# Patient Record
Sex: Male | Born: 1950
Health system: Southern US, Community
[De-identification: ages and names within clinical notes are randomized; demographics above are authoritative.]

## PROBLEM LIST (undated history)

## (undated) DIAGNOSIS — G709 Myoneural disorder, unspecified: Secondary | ICD-10-CM

## (undated) DIAGNOSIS — M5020 Other cervical disc displacement, unspecified cervical region: Secondary | ICD-10-CM

## (undated) DIAGNOSIS — E119 Type 2 diabetes mellitus without complications: Secondary | ICD-10-CM

## (undated) DIAGNOSIS — F32A Depression, unspecified: Secondary | ICD-10-CM

## (undated) DIAGNOSIS — G4730 Sleep apnea, unspecified: Secondary | ICD-10-CM

## (undated) DIAGNOSIS — E785 Hyperlipidemia, unspecified: Secondary | ICD-10-CM

## (undated) DIAGNOSIS — F329 Major depressive disorder, single episode, unspecified: Secondary | ICD-10-CM

## (undated) DIAGNOSIS — Z87442 Personal history of urinary calculi: Secondary | ICD-10-CM

## (undated) DIAGNOSIS — K219 Gastro-esophageal reflux disease without esophagitis: Secondary | ICD-10-CM

## (undated) DIAGNOSIS — I10 Essential (primary) hypertension: Secondary | ICD-10-CM

## (undated) DIAGNOSIS — M1990 Unspecified osteoarthritis, unspecified site: Secondary | ICD-10-CM

## (undated) HISTORY — PX: EYE SURGERY: SHX253

## (undated) HISTORY — DX: Unspecified osteoarthritis, unspecified site: M19.90

## (undated) HISTORY — DX: Hyperlipidemia, unspecified: E78.5

---

## 2011-03-28 DIAGNOSIS — F339 Major depressive disorder, recurrent, unspecified: Secondary | ICD-10-CM | POA: Insufficient documentation

## 2011-03-28 DIAGNOSIS — E785 Hyperlipidemia, unspecified: Secondary | ICD-10-CM | POA: Insufficient documentation

## 2011-03-28 DIAGNOSIS — I10 Essential (primary) hypertension: Secondary | ICD-10-CM | POA: Insufficient documentation

## 2011-09-04 HISTORY — PX: COLONOSCOPY: SHX174

## 2011-09-25 DIAGNOSIS — Z1211 Encounter for screening for malignant neoplasm of colon: Secondary | ICD-10-CM | POA: Insufficient documentation

## 2011-09-25 LAB — HM COLONOSCOPY

## 2012-06-09 DIAGNOSIS — M542 Cervicalgia: Secondary | ICD-10-CM | POA: Insufficient documentation

## 2012-06-13 DIAGNOSIS — H52229 Regular astigmatism, unspecified eye: Secondary | ICD-10-CM | POA: Insufficient documentation

## 2012-06-13 DIAGNOSIS — E119 Type 2 diabetes mellitus without complications: Secondary | ICD-10-CM | POA: Insufficient documentation

## 2012-06-13 DIAGNOSIS — IMO0002 Reserved for concepts with insufficient information to code with codable children: Secondary | ICD-10-CM | POA: Insufficient documentation

## 2012-06-13 DIAGNOSIS — Z961 Presence of intraocular lens: Secondary | ICD-10-CM | POA: Insufficient documentation

## 2012-06-13 DIAGNOSIS — H18599 Other hereditary corneal dystrophies, unspecified eye: Secondary | ICD-10-CM | POA: Insufficient documentation

## 2012-07-29 DIAGNOSIS — G4733 Obstructive sleep apnea (adult) (pediatric): Secondary | ICD-10-CM | POA: Insufficient documentation

## 2013-05-29 DIAGNOSIS — N529 Male erectile dysfunction, unspecified: Secondary | ICD-10-CM | POA: Insufficient documentation

## 2013-05-29 DIAGNOSIS — G4700 Insomnia, unspecified: Secondary | ICD-10-CM | POA: Insufficient documentation

## 2013-05-29 DIAGNOSIS — M5412 Radiculopathy, cervical region: Secondary | ICD-10-CM | POA: Insufficient documentation

## 2013-11-12 DIAGNOSIS — H01009 Unspecified blepharitis unspecified eye, unspecified eyelid: Secondary | ICD-10-CM | POA: Insufficient documentation

## 2013-11-12 DIAGNOSIS — H1851 Endothelial corneal dystrophy: Secondary | ICD-10-CM

## 2013-11-12 DIAGNOSIS — Z0389 Encounter for observation for other suspected diseases and conditions ruled out: Secondary | ICD-10-CM

## 2013-11-12 DIAGNOSIS — H43819 Vitreous degeneration, unspecified eye: Secondary | ICD-10-CM | POA: Insufficient documentation

## 2013-11-12 DIAGNOSIS — Z961 Presence of intraocular lens: Secondary | ICD-10-CM | POA: Insufficient documentation

## 2013-11-12 DIAGNOSIS — H18519 Endothelial corneal dystrophy, unspecified eye: Secondary | ICD-10-CM | POA: Insufficient documentation

## 2013-11-12 DIAGNOSIS — IMO0001 Reserved for inherently not codable concepts without codable children: Secondary | ICD-10-CM | POA: Insufficient documentation

## 2014-07-19 DIAGNOSIS — N3941 Urge incontinence: Secondary | ICD-10-CM | POA: Insufficient documentation

## 2014-09-03 HISTORY — PX: BASAL CELL CARCINOMA EXCISION: SHX1214

## 2015-04-26 DIAGNOSIS — M25521 Pain in right elbow: Secondary | ICD-10-CM | POA: Insufficient documentation

## 2015-04-26 DIAGNOSIS — K219 Gastro-esophageal reflux disease without esophagitis: Secondary | ICD-10-CM | POA: Insufficient documentation

## 2015-04-26 DIAGNOSIS — L918 Other hypertrophic disorders of the skin: Secondary | ICD-10-CM | POA: Insufficient documentation

## 2015-06-16 DIAGNOSIS — C44519 Basal cell carcinoma of skin of other part of trunk: Secondary | ICD-10-CM | POA: Insufficient documentation

## 2015-08-05 DIAGNOSIS — N189 Chronic kidney disease, unspecified: Secondary | ICD-10-CM | POA: Insufficient documentation

## 2015-10-10 DIAGNOSIS — E7800 Pure hypercholesterolemia, unspecified: Secondary | ICD-10-CM | POA: Diagnosis not present

## 2015-10-10 DIAGNOSIS — E1122 Type 2 diabetes mellitus with diabetic chronic kidney disease: Secondary | ICD-10-CM | POA: Diagnosis not present

## 2015-10-10 DIAGNOSIS — F322 Major depressive disorder, single episode, severe without psychotic features: Secondary | ICD-10-CM | POA: Diagnosis not present

## 2015-10-10 DIAGNOSIS — Z7984 Long term (current) use of oral hypoglycemic drugs: Secondary | ICD-10-CM | POA: Diagnosis not present

## 2015-10-10 DIAGNOSIS — M5090 Cervical disc disorder, unspecified, unspecified cervical region: Secondary | ICD-10-CM | POA: Diagnosis not present

## 2015-10-10 DIAGNOSIS — I10 Essential (primary) hypertension: Secondary | ICD-10-CM | POA: Diagnosis not present

## 2015-10-10 DIAGNOSIS — N183 Chronic kidney disease, stage 3 (moderate): Secondary | ICD-10-CM | POA: Diagnosis not present

## 2015-10-10 DIAGNOSIS — G4733 Obstructive sleep apnea (adult) (pediatric): Secondary | ICD-10-CM | POA: Diagnosis not present

## 2015-10-10 DIAGNOSIS — Z1389 Encounter for screening for other disorder: Secondary | ICD-10-CM | POA: Diagnosis not present

## 2015-10-10 DIAGNOSIS — K219 Gastro-esophageal reflux disease without esophagitis: Secondary | ICD-10-CM | POA: Diagnosis not present

## 2016-01-25 DIAGNOSIS — F322 Major depressive disorder, single episode, severe without psychotic features: Secondary | ICD-10-CM | POA: Diagnosis not present

## 2016-01-25 DIAGNOSIS — I10 Essential (primary) hypertension: Secondary | ICD-10-CM | POA: Diagnosis not present

## 2016-01-25 DIAGNOSIS — E7800 Pure hypercholesterolemia, unspecified: Secondary | ICD-10-CM | POA: Diagnosis not present

## 2016-01-25 DIAGNOSIS — N183 Chronic kidney disease, stage 3 (moderate): Secondary | ICD-10-CM | POA: Diagnosis not present

## 2016-01-25 DIAGNOSIS — Z7984 Long term (current) use of oral hypoglycemic drugs: Secondary | ICD-10-CM | POA: Diagnosis not present

## 2016-01-25 DIAGNOSIS — M5090 Cervical disc disorder, unspecified, unspecified cervical region: Secondary | ICD-10-CM | POA: Diagnosis not present

## 2016-01-25 DIAGNOSIS — Z1389 Encounter for screening for other disorder: Secondary | ICD-10-CM | POA: Diagnosis not present

## 2016-01-25 DIAGNOSIS — K219 Gastro-esophageal reflux disease without esophagitis: Secondary | ICD-10-CM | POA: Diagnosis not present

## 2016-01-25 DIAGNOSIS — Z0000 Encounter for general adult medical examination without abnormal findings: Secondary | ICD-10-CM | POA: Diagnosis not present

## 2016-01-25 DIAGNOSIS — G4733 Obstructive sleep apnea (adult) (pediatric): Secondary | ICD-10-CM | POA: Diagnosis not present

## 2016-01-25 DIAGNOSIS — E1122 Type 2 diabetes mellitus with diabetic chronic kidney disease: Secondary | ICD-10-CM | POA: Diagnosis not present

## 2016-01-25 DIAGNOSIS — Z125 Encounter for screening for malignant neoplasm of prostate: Secondary | ICD-10-CM | POA: Diagnosis not present

## 2016-02-20 DIAGNOSIS — H40011 Open angle with borderline findings, low risk, right eye: Secondary | ICD-10-CM | POA: Diagnosis not present

## 2016-02-20 DIAGNOSIS — H52203 Unspecified astigmatism, bilateral: Secondary | ICD-10-CM | POA: Diagnosis not present

## 2016-02-20 DIAGNOSIS — E119 Type 2 diabetes mellitus without complications: Secondary | ICD-10-CM | POA: Diagnosis not present

## 2016-02-20 DIAGNOSIS — H40012 Open angle with borderline findings, low risk, left eye: Secondary | ICD-10-CM | POA: Diagnosis not present

## 2016-07-16 DIAGNOSIS — H40012 Open angle with borderline findings, low risk, left eye: Secondary | ICD-10-CM | POA: Diagnosis not present

## 2016-07-16 DIAGNOSIS — H40011 Open angle with borderline findings, low risk, right eye: Secondary | ICD-10-CM | POA: Diagnosis not present

## 2016-08-01 DIAGNOSIS — N183 Chronic kidney disease, stage 3 (moderate): Secondary | ICD-10-CM | POA: Diagnosis not present

## 2016-08-01 DIAGNOSIS — E1122 Type 2 diabetes mellitus with diabetic chronic kidney disease: Secondary | ICD-10-CM | POA: Diagnosis not present

## 2016-08-01 DIAGNOSIS — K219 Gastro-esophageal reflux disease without esophagitis: Secondary | ICD-10-CM | POA: Diagnosis not present

## 2016-08-01 DIAGNOSIS — Z7984 Long term (current) use of oral hypoglycemic drugs: Secondary | ICD-10-CM | POA: Diagnosis not present

## 2016-08-01 DIAGNOSIS — G4733 Obstructive sleep apnea (adult) (pediatric): Secondary | ICD-10-CM | POA: Diagnosis not present

## 2016-08-01 DIAGNOSIS — E7800 Pure hypercholesterolemia, unspecified: Secondary | ICD-10-CM | POA: Diagnosis not present

## 2016-08-01 DIAGNOSIS — I10 Essential (primary) hypertension: Secondary | ICD-10-CM | POA: Diagnosis not present

## 2016-08-01 DIAGNOSIS — Z23 Encounter for immunization: Secondary | ICD-10-CM | POA: Diagnosis not present

## 2016-08-01 DIAGNOSIS — F322 Major depressive disorder, single episode, severe without psychotic features: Secondary | ICD-10-CM | POA: Diagnosis not present

## 2016-08-01 DIAGNOSIS — J309 Allergic rhinitis, unspecified: Secondary | ICD-10-CM | POA: Diagnosis not present

## 2016-09-14 ENCOUNTER — Other Ambulatory Visit: Payer: Self-pay | Admitting: Nurse Practitioner

## 2016-09-14 ENCOUNTER — Ambulatory Visit
Admission: RE | Admit: 2016-09-14 | Discharge: 2016-09-14 | Disposition: A | Discharge status: Home / Self Care | Payer: PPO | Source: Ambulatory Visit | Attending: Nurse Practitioner | Admitting: Nurse Practitioner

## 2016-09-14 DIAGNOSIS — R0602 Shortness of breath: Secondary | ICD-10-CM | POA: Diagnosis not present

## 2016-09-14 DIAGNOSIS — J069 Acute upper respiratory infection, unspecified: Secondary | ICD-10-CM | POA: Diagnosis not present

## 2016-09-14 DIAGNOSIS — R05 Cough: Secondary | ICD-10-CM | POA: Diagnosis not present

## 2016-09-14 DIAGNOSIS — R509 Fever, unspecified: Secondary | ICD-10-CM | POA: Diagnosis not present

## 2016-10-16 DIAGNOSIS — G4733 Obstructive sleep apnea (adult) (pediatric): Secondary | ICD-10-CM | POA: Diagnosis not present

## 2016-11-15 DIAGNOSIS — G4733 Obstructive sleep apnea (adult) (pediatric): Secondary | ICD-10-CM | POA: Diagnosis not present

## 2016-11-27 DIAGNOSIS — F332 Major depressive disorder, recurrent severe without psychotic features: Secondary | ICD-10-CM | POA: Diagnosis not present

## 2016-12-20 DIAGNOSIS — G4700 Insomnia, unspecified: Secondary | ICD-10-CM | POA: Diagnosis not present

## 2017-01-22 DIAGNOSIS — H43813 Vitreous degeneration, bilateral: Secondary | ICD-10-CM | POA: Diagnosis not present

## 2017-01-22 DIAGNOSIS — H40013 Open angle with borderline findings, low risk, bilateral: Secondary | ICD-10-CM | POA: Diagnosis not present

## 2017-01-22 DIAGNOSIS — H52203 Unspecified astigmatism, bilateral: Secondary | ICD-10-CM | POA: Diagnosis not present

## 2017-01-22 DIAGNOSIS — E119 Type 2 diabetes mellitus without complications: Secondary | ICD-10-CM | POA: Diagnosis not present

## 2017-01-29 DIAGNOSIS — F332 Major depressive disorder, recurrent severe without psychotic features: Secondary | ICD-10-CM | POA: Diagnosis not present

## 2017-01-31 DIAGNOSIS — G4700 Insomnia, unspecified: Secondary | ICD-10-CM | POA: Diagnosis not present

## 2017-02-05 DIAGNOSIS — N183 Chronic kidney disease, stage 3 (moderate): Secondary | ICD-10-CM | POA: Diagnosis not present

## 2017-02-05 DIAGNOSIS — G4700 Insomnia, unspecified: Secondary | ICD-10-CM | POA: Diagnosis not present

## 2017-02-05 DIAGNOSIS — E1122 Type 2 diabetes mellitus with diabetic chronic kidney disease: Secondary | ICD-10-CM | POA: Diagnosis not present

## 2017-02-05 DIAGNOSIS — E7800 Pure hypercholesterolemia, unspecified: Secondary | ICD-10-CM | POA: Diagnosis not present

## 2017-02-05 DIAGNOSIS — Z125 Encounter for screening for malignant neoplasm of prostate: Secondary | ICD-10-CM | POA: Diagnosis not present

## 2017-02-05 DIAGNOSIS — J309 Allergic rhinitis, unspecified: Secondary | ICD-10-CM | POA: Diagnosis not present

## 2017-02-05 DIAGNOSIS — F322 Major depressive disorder, single episode, severe without psychotic features: Secondary | ICD-10-CM | POA: Diagnosis not present

## 2017-02-05 DIAGNOSIS — D229 Melanocytic nevi, unspecified: Secondary | ICD-10-CM | POA: Diagnosis not present

## 2017-02-05 DIAGNOSIS — Z1159 Encounter for screening for other viral diseases: Secondary | ICD-10-CM | POA: Diagnosis not present

## 2017-02-05 DIAGNOSIS — Z7189 Other specified counseling: Secondary | ICD-10-CM | POA: Diagnosis not present

## 2017-02-05 DIAGNOSIS — Z7984 Long term (current) use of oral hypoglycemic drugs: Secondary | ICD-10-CM | POA: Diagnosis not present

## 2017-02-05 DIAGNOSIS — I10 Essential (primary) hypertension: Secondary | ICD-10-CM | POA: Diagnosis not present

## 2017-02-05 DIAGNOSIS — N529 Male erectile dysfunction, unspecified: Secondary | ICD-10-CM | POA: Diagnosis not present

## 2017-02-05 DIAGNOSIS — K219 Gastro-esophageal reflux disease without esophagitis: Secondary | ICD-10-CM | POA: Diagnosis not present

## 2017-02-10 IMAGING — CR DG CHEST 2V
2 series · 2 of 2 positions shown · non-contrast
Comparison: None.

CLINICAL DATA: Shortness of breath, cough and congestion for 8
days.

EXAM:
CHEST  2 VIEW

[w chest lat]
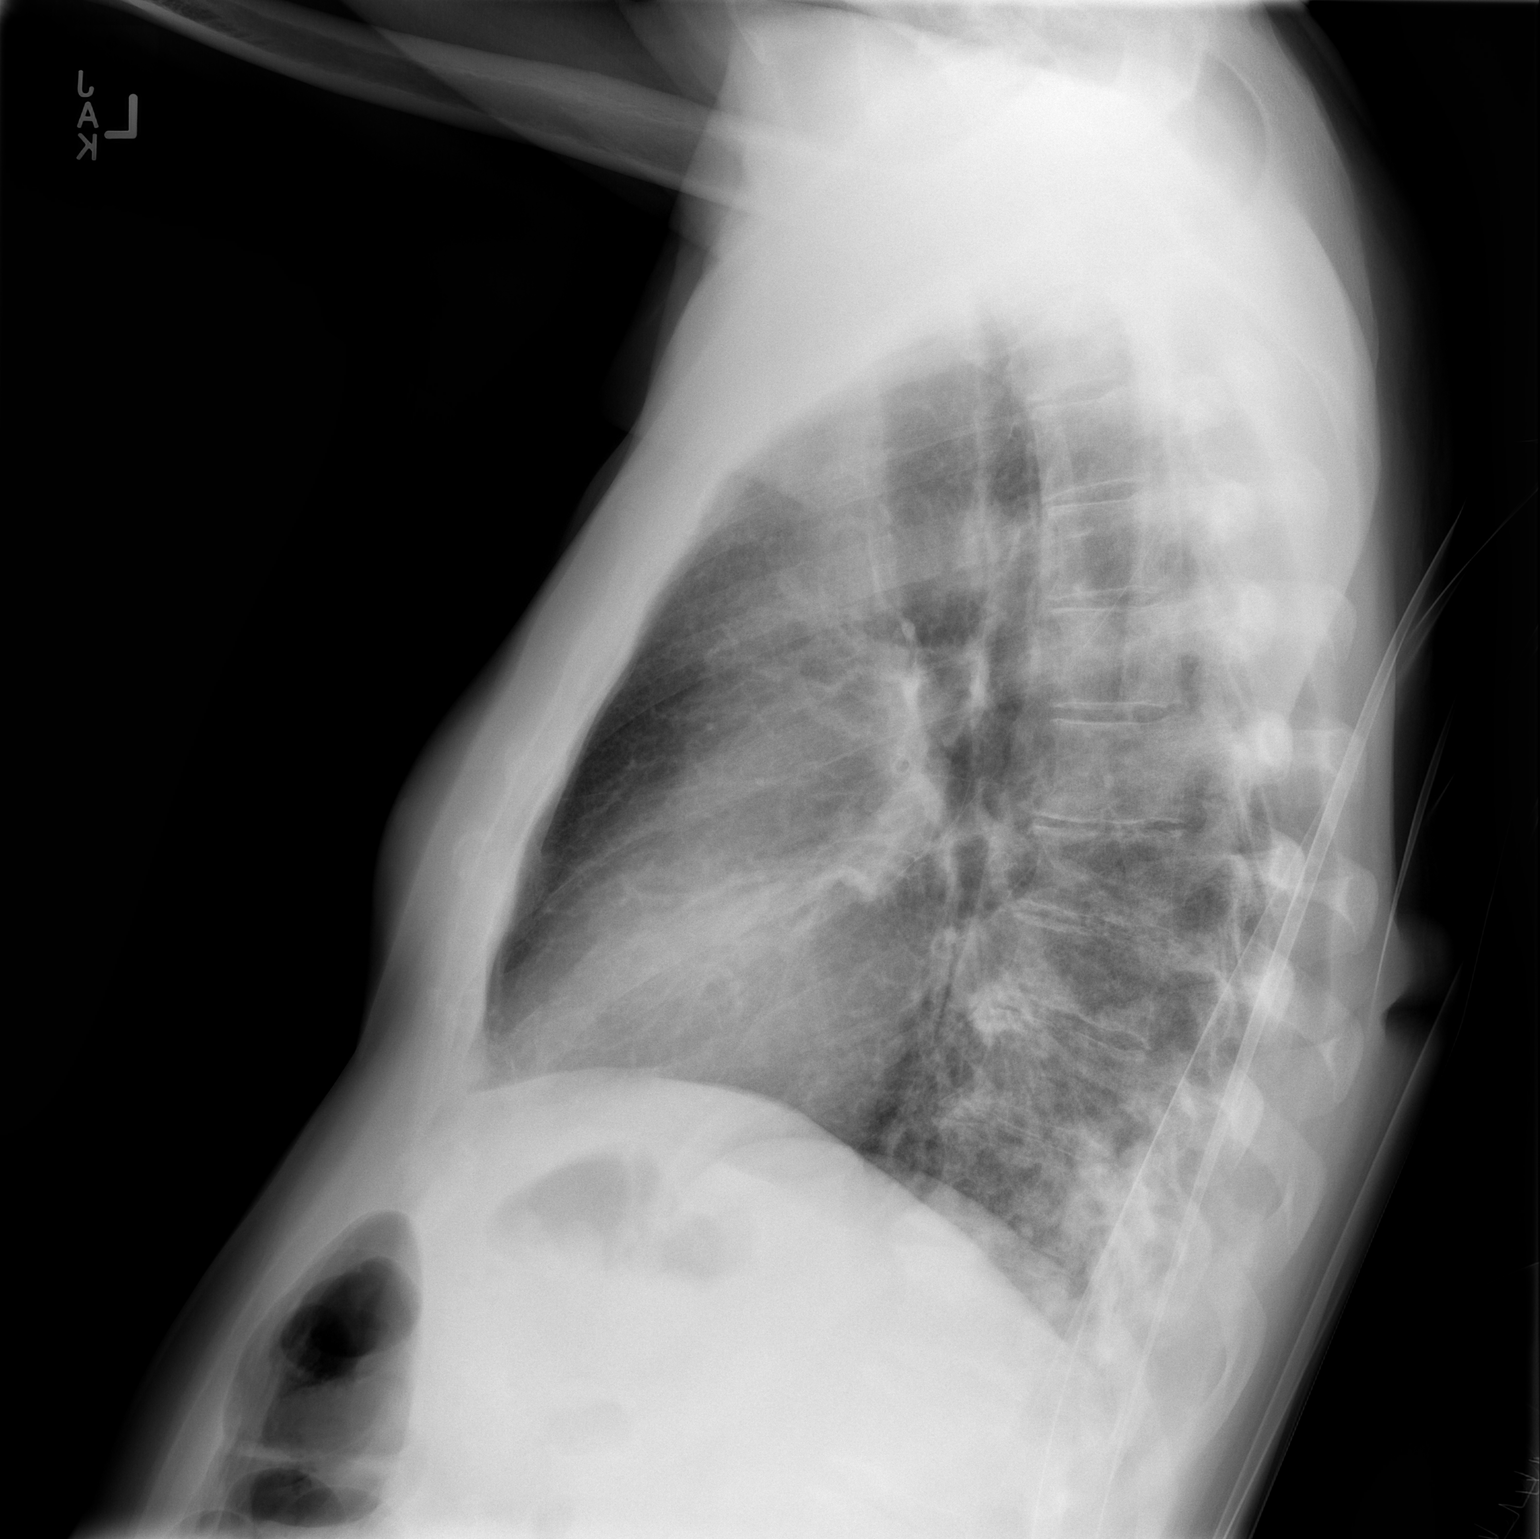

[w chest ap]
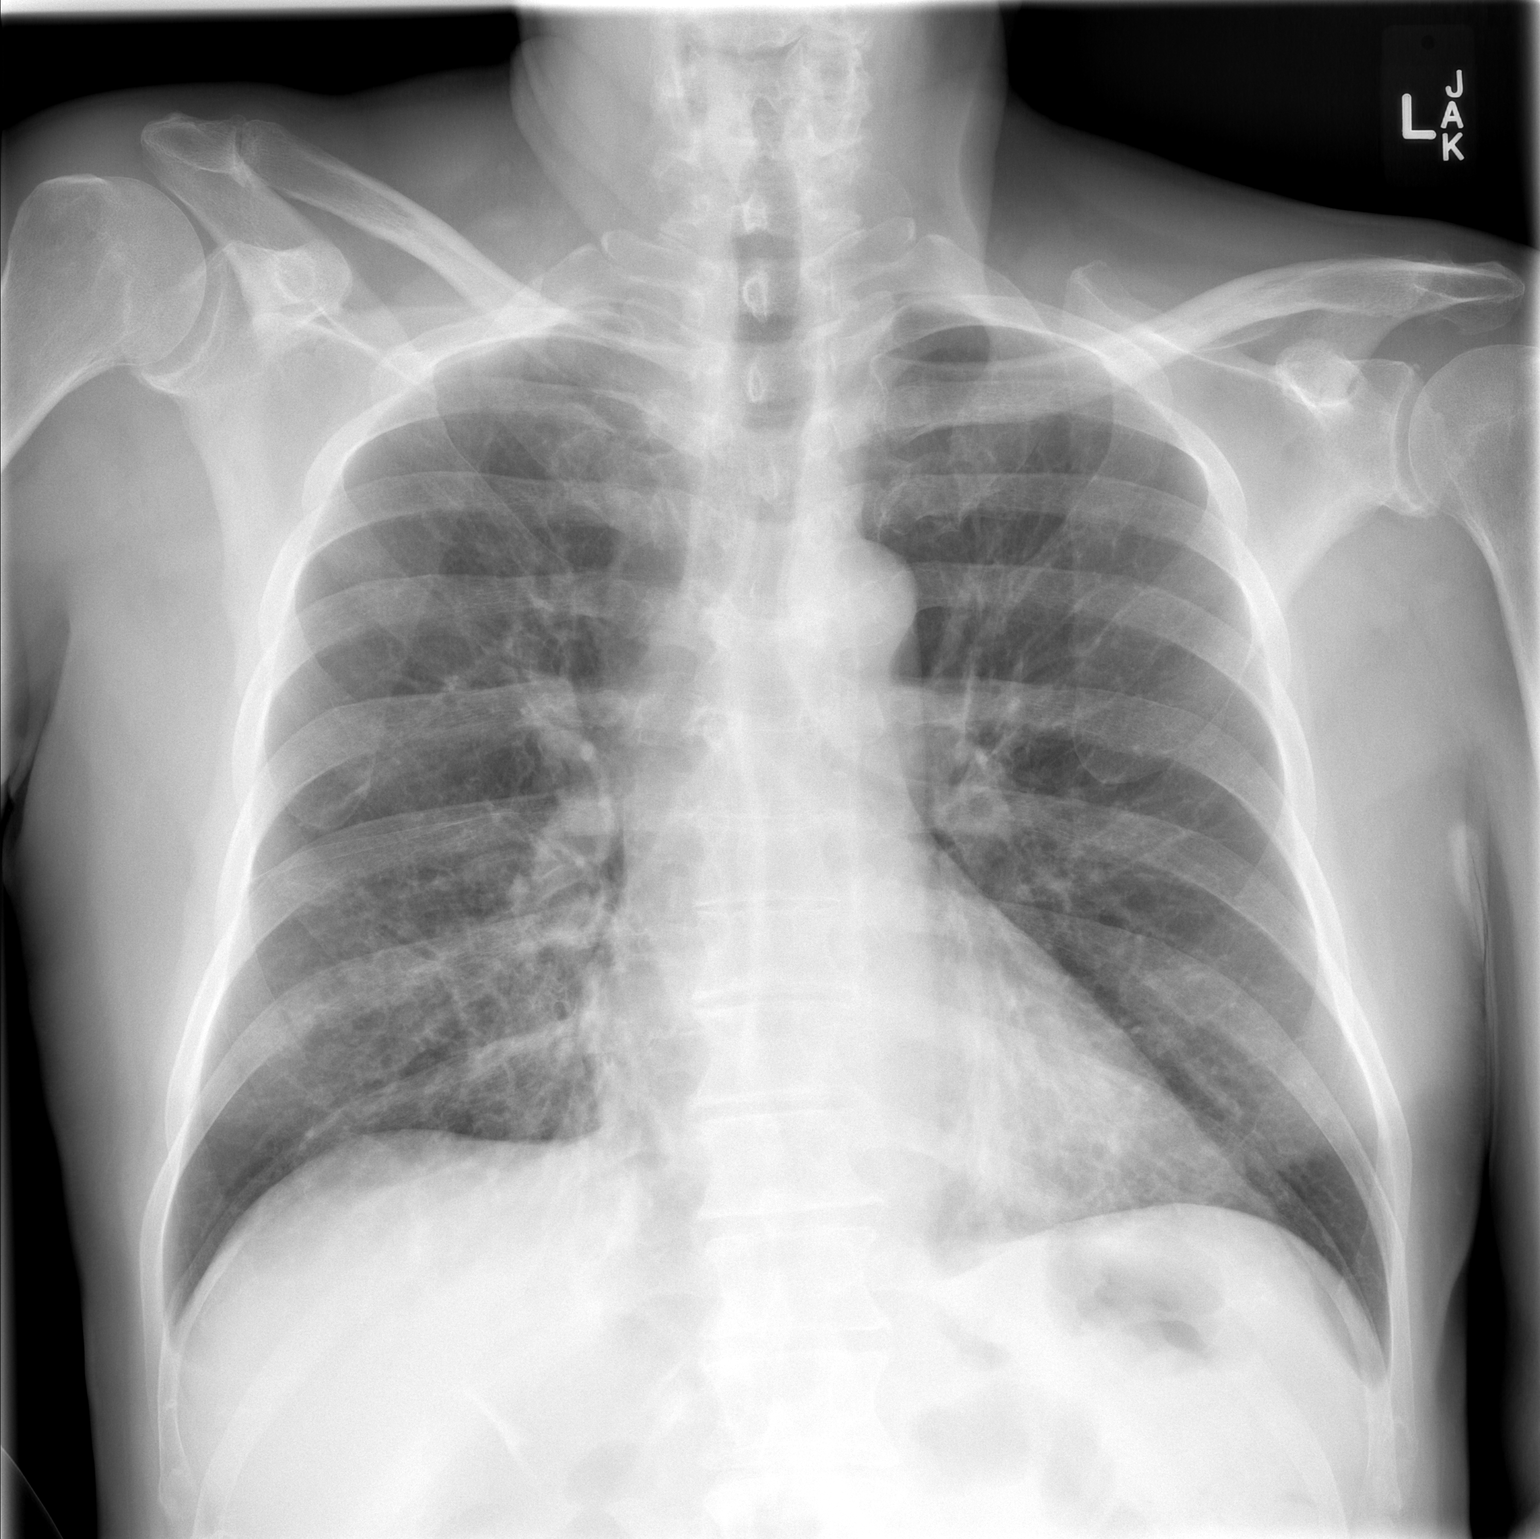

[2 of 2 positions shown; findings below may reference images not displayed]

FINDINGS: There is some peribronchial thickening. No consolidative process,
pneumothorax or effusion. Heart size is normal. No acute bony
abnormality.
IMPRESSION: Findings compatible with bronchitis.

## 2017-03-11 DIAGNOSIS — L821 Other seborrheic keratosis: Secondary | ICD-10-CM | POA: Diagnosis not present

## 2017-03-11 DIAGNOSIS — R799 Abnormal finding of blood chemistry, unspecified: Secondary | ICD-10-CM | POA: Diagnosis not present

## 2017-03-11 DIAGNOSIS — D1801 Hemangioma of skin and subcutaneous tissue: Secondary | ICD-10-CM | POA: Diagnosis not present

## 2017-03-11 DIAGNOSIS — R748 Abnormal levels of other serum enzymes: Secondary | ICD-10-CM | POA: Diagnosis not present

## 2017-03-12 ENCOUNTER — Other Ambulatory Visit: Payer: Self-pay | Admitting: Internal Medicine

## 2017-03-12 DIAGNOSIS — N183 Chronic kidney disease, stage 3 unspecified: Secondary | ICD-10-CM

## 2017-03-13 DIAGNOSIS — F332 Major depressive disorder, recurrent severe without psychotic features: Secondary | ICD-10-CM | POA: Diagnosis not present

## 2017-03-18 ENCOUNTER — Ambulatory Visit
Admission: RE | Admit: 2017-03-18 | Discharge: 2017-03-18 | Disposition: A | Discharge status: Home / Self Care | Payer: PPO | Source: Ambulatory Visit | Attending: Internal Medicine | Admitting: Internal Medicine

## 2017-03-18 DIAGNOSIS — N183 Chronic kidney disease, stage 3 unspecified: Secondary | ICD-10-CM

## 2017-03-29 DIAGNOSIS — N200 Calculus of kidney: Secondary | ICD-10-CM | POA: Diagnosis not present

## 2017-03-29 DIAGNOSIS — N5201 Erectile dysfunction due to arterial insufficiency: Secondary | ICD-10-CM | POA: Diagnosis not present

## 2017-04-01 ENCOUNTER — Other Ambulatory Visit: Payer: Self-pay | Admitting: Urology

## 2017-04-02 ENCOUNTER — Other Ambulatory Visit: Payer: Self-pay | Admitting: Urology

## 2017-04-16 ENCOUNTER — Encounter (HOSPITAL_COMMUNITY): Payer: Self-pay | Admitting: *Deleted

## 2017-04-18 ENCOUNTER — Encounter (HOSPITAL_COMMUNITY)
Admission: RE | Disposition: A | Discharge status: Home / Self Care | Payer: Self-pay | Source: Ambulatory Visit | Attending: Urology

## 2017-04-18 ENCOUNTER — Ambulatory Visit (HOSPITAL_COMMUNITY)
Admission: RE | Admit: 2017-04-18 | Discharge: 2017-04-18 | Disposition: A | Discharge status: Home / Self Care | Payer: PPO | Source: Ambulatory Visit | Attending: Urology | Admitting: Urology

## 2017-04-18 ENCOUNTER — Encounter (HOSPITAL_COMMUNITY): Payer: Self-pay | Admitting: General Practice

## 2017-04-18 ENCOUNTER — Ambulatory Visit (HOSPITAL_COMMUNITY): Payer: PPO

## 2017-04-18 DIAGNOSIS — E7800 Pure hypercholesterolemia, unspecified: Secondary | ICD-10-CM | POA: Diagnosis not present

## 2017-04-18 DIAGNOSIS — N5201 Erectile dysfunction due to arterial insufficiency: Secondary | ICD-10-CM | POA: Insufficient documentation

## 2017-04-18 DIAGNOSIS — N200 Calculus of kidney: Secondary | ICD-10-CM | POA: Insufficient documentation

## 2017-04-18 DIAGNOSIS — I10 Essential (primary) hypertension: Secondary | ICD-10-CM | POA: Insufficient documentation

## 2017-04-18 DIAGNOSIS — Z791 Long term (current) use of non-steroidal anti-inflammatories (NSAID): Secondary | ICD-10-CM | POA: Insufficient documentation

## 2017-04-18 DIAGNOSIS — K219 Gastro-esophageal reflux disease without esophagitis: Secondary | ICD-10-CM | POA: Diagnosis not present

## 2017-04-18 DIAGNOSIS — Z7984 Long term (current) use of oral hypoglycemic drugs: Secondary | ICD-10-CM | POA: Insufficient documentation

## 2017-04-18 DIAGNOSIS — Z85828 Personal history of other malignant neoplasm of skin: Secondary | ICD-10-CM | POA: Insufficient documentation

## 2017-04-18 DIAGNOSIS — E119 Type 2 diabetes mellitus without complications: Secondary | ICD-10-CM | POA: Diagnosis not present

## 2017-04-18 DIAGNOSIS — Z7982 Long term (current) use of aspirin: Secondary | ICD-10-CM | POA: Insufficient documentation

## 2017-04-18 DIAGNOSIS — G4730 Sleep apnea, unspecified: Secondary | ICD-10-CM | POA: Insufficient documentation

## 2017-04-18 DIAGNOSIS — Z79899 Other long term (current) drug therapy: Secondary | ICD-10-CM | POA: Insufficient documentation

## 2017-04-18 DIAGNOSIS — Z87442 Personal history of urinary calculi: Secondary | ICD-10-CM | POA: Insufficient documentation

## 2017-04-18 DIAGNOSIS — F329 Major depressive disorder, single episode, unspecified: Secondary | ICD-10-CM | POA: Diagnosis not present

## 2017-04-18 HISTORY — DX: Gastro-esophageal reflux disease without esophagitis: K21.9

## 2017-04-18 HISTORY — DX: Essential (primary) hypertension: I10

## 2017-04-18 HISTORY — DX: Personal history of urinary calculi: Z87.442

## 2017-04-18 HISTORY — DX: Myoneural disorder, unspecified: G70.9

## 2017-04-18 HISTORY — PX: EXTRACORPOREAL SHOCK WAVE LITHOTRIPSY: SHX1557

## 2017-04-18 HISTORY — DX: Depression, unspecified: F32.A

## 2017-04-18 HISTORY — DX: Type 2 diabetes mellitus without complications: E11.9

## 2017-04-18 HISTORY — DX: Sleep apnea, unspecified: G47.30

## 2017-04-18 HISTORY — DX: Major depressive disorder, single episode, unspecified: F32.9

## 2017-04-18 HISTORY — DX: Other cervical disc displacement, unspecified cervical region: M50.20

## 2017-04-18 LAB — GLUCOSE, CAPILLARY: Glucose-Capillary: 145 mg/dL — ABNORMAL HIGH (ref 65–99)

## 2017-04-18 SURGERY — LITHOTRIPSY, ESWL
Anesthesia: LOCAL | Laterality: Left

## 2017-04-18 MED ORDER — MORPHINE SULFATE (PF) 4 MG/ML IV SOLN: 2.0000 mg | INTRAVENOUS | Status: DC | PRN

## 2017-04-18 MED ORDER — OXYCODONE-ACETAMINOPHEN 5-325 MG PO TABS
1.0000 | ORAL_TABLET | ORAL | 0 refills | Status: DC | PRN
Start: 2017-04-18 — End: 2018-05-02

## 2017-04-18 MED ORDER — TAMSULOSIN HCL 0.4 MG PO CAPS: 0.4000 mg | ORAL_CAPSULE | Freq: Every day | ORAL | 1 refills | Status: DC

## 2017-04-18 MED ORDER — DIAZEPAM 5 MG PO TABS
10.0000 mg | ORAL_TABLET | ORAL | Status: AC
  Administered 2017-04-18: 10 mg via ORAL
  Filled 2017-04-18: qty 2

## 2017-04-18 MED ORDER — ACETAMINOPHEN 325 MG PO TABS: 650.0000 mg | ORAL_TABLET | ORAL | Status: DC | PRN

## 2017-04-18 MED ORDER — OXYCODONE HCL 5 MG PO TABS: 5.0000 mg | ORAL_TABLET | ORAL | Status: DC | PRN

## 2017-04-18 MED ORDER — DIPHENHYDRAMINE HCL 25 MG PO CAPS
25.0000 mg | ORAL_CAPSULE | ORAL | Status: AC
  Administered 2017-04-18: 25 mg via ORAL
  Filled 2017-04-18: qty 1

## 2017-04-18 MED ORDER — CIPROFLOXACIN HCL 500 MG PO TABS
500.0000 mg | ORAL_TABLET | ORAL | Status: AC
  Administered 2017-04-18: 500 mg via ORAL
  Filled 2017-04-18: qty 1

## 2017-04-18 MED ORDER — SODIUM CHLORIDE 0.9 % IV SOLN
250.0000 mL | INTRAVENOUS | Status: DC | PRN
Start: 2017-04-18 — End: 2017-04-18

## 2017-04-18 MED ORDER — SODIUM CHLORIDE 0.9% FLUSH: 3.0000 mL | INTRAVENOUS | Status: DC | PRN

## 2017-04-18 MED ORDER — SODIUM CHLORIDE 0.9% FLUSH: 3.0000 mL | Freq: Two times a day (BID) | INTRAVENOUS | Status: DC

## 2017-04-18 MED ORDER — ONDANSETRON HCL 4 MG PO TABS: 4.0000 mg | ORAL_TABLET | Freq: Three times a day (TID) | ORAL | 1 refills | Status: AC | PRN

## 2017-04-18 MED ORDER — SODIUM CHLORIDE 0.9 % IV SOLN
INTRAVENOUS | Status: DC
  Administered 2017-04-18: 09:00:00 via INTRAVENOUS

## 2017-04-18 MED ORDER — ACETAMINOPHEN 650 MG RE SUPP
650.0000 mg | RECTAL | Status: DC | PRN
  Filled 2017-04-18: qty 1

## 2017-04-18 NOTE — Interval H&P Note (Signed)
History and Physical Interval Note:  No change in stone.  04/18/2017 10:21 AM  Blake Young.  has presented today for surgery, with the diagnosis of LEFT RENAL STONE  The various methods of treatment have been discussed with the patient and family. After consideration of risks, benefits and other options for treatment, the patient has consented to  Procedure(s): LEFT EXTRACORPOREAL SHOCK WAVE LITHOTRIPSY (ESWL) (Left) as a surgical intervention .  The patient's history has been reviewed, patient examined, no change in status, stable for surgery.  I have reviewed the patient's chart and labs.  Questions were answered to the patient's satisfaction.     Blake Young

## 2017-04-18 NOTE — Discharge Instructions (Signed)
Lithotripsy, Care After °This sheet gives you information about how to care for yourself after your procedure. Your health care provider may also give you more specific instructions. If you have problems or questions, contact your health care provider. °What can I expect after the procedure? °After the procedure, it is common to have: °· Some blood in your urine. This should only last for a few days. °· Soreness in your back, sides, or upper abdomen for a few days. °· Blotches or bruises on your back where the pressure wave entered the skin. °· Pain, discomfort, or nausea when pieces (fragments) of the kidney stone move through the tube that carries urine from the kidney to the bladder (ureter). Stone fragments may pass soon after the procedure, but they may continue to pass for up to 4-8 weeks. °? If you have severe pain or nausea, contact your health care provider. This may be caused by a large stone that was not broken up, and this may mean that you need more treatment. °· Some pain or discomfort during urination. °· Some pain or discomfort in the lower abdomen or (in men) at the base of the penis. ° °Follow these instructions at home: °Medicines °· Take over-the-counter and prescription medicines only as told by your health care provider. °· If you were prescribed an antibiotic medicine, take it as told by your health care provider. Do not stop taking the antibiotic even if you start to feel better. °· Do not drive for 24 hours if you were given a medicine to help you relax (sedative). °· Do not drive or use heavy machinery while taking prescription pain medicine. °Eating and drinking °· Drink enough water and fluids to keep your urine clear or pale yellow. This helps any remaining pieces of the stone to pass. It can also help prevent new stones from forming. °· Eat plenty of fresh fruits and vegetables. °· Follow instructions from your health care provider about eating and drinking restrictions. You may be  instructed: °? To reduce how much salt (sodium) you eat or drink. Check ingredients and nutrition facts on packaged foods and beverages. °? To reduce how much meat you eat. °· Eat the recommended amount of calcium for your age and gender. Ask your health care provider how much calcium you should have. °General instructions °· Get plenty of rest. °· Most people can resume normal activities 1-2 days after the procedure. Ask your health care provider what activities are safe for you. °· If directed, strain all urine through the strainer that was provided by your health care provider. °? Keep all fragments for your health care provider to see. Any stones that are found may be sent to a medical lab for examination. The stone may be as small as a grain of salt. °· Keep all follow-up visits as told by your health care provider. This is important. °Contact a health care provider if: °· You have pain that is severe or does not get better with medicine. °· You have nausea that is severe or does not go away. °· You have blood in your urine longer than your health care provider told you to expect. °· You have more blood in your urine. °· You have pain during urination that does not go away. °· You urinate more frequently than usual and this does not go away. °· You develop a rash or any other possible signs of an allergic reaction. °Get help right away if: °· You have severe pain in   your back, sides, or upper abdomen.  You have severe pain while urinating.  Your urine is very dark red.  You have blood in your stool (feces).  You cannot pass any urine at all.  You feel a strong urge to urinate after emptying your bladder.  You have a fever or chills.  You develop shortness of breath, difficulty breathing, or chest pain.  You have severe nausea that leads to persistent vomiting.  You faint. Summary  After this procedure, it is common to have some pain, discomfort, or nausea when pieces (fragments) of the  kidney stone move through the tube that carries urine from the kidney to the bladder (ureter). If this pain or nausea is severe, however, you should contact your health care provider.  Most people can resume normal activities 1-2 days after the procedure. Ask your health care provider what activities are safe for you.  Drink enough water and fluids to keep your urine clear or pale yellow. This helps any remaining pieces of the stone to pass, and it can help prevent new stones from forming.  If directed, strain your urine and keep all fragments for your health care provider to see. Fragments or stones may be as small as a grain of salt.  Get help right away if you have severe pain in your back, sides, or upper abdomen or have severe pain while urinating. This information is not intended to replace advice given to you by your health care provider. Make sure you discuss any questions you have with your health care provider.  I have prescribed tamsulosin to aid in stone passage.   Take 1/2 hour after a meal once daily.   Side effects can be nasal congestion and dizziness with standing.   Document Released: 09/09/2007 Document Revised: 07/11/2016 Document Reviewed: 07/11/2016 Elsevier Interactive Patient Education  2017 [employer].

## 2017-04-18 NOTE — H&P (Signed)
CC: I have kidney stones.  HPI: Blake Young is a 66 year-old male patient who was referred by Dr. Wenda Low, MD who is here for renal calculi.  The problem is on both sides. This is not his first kidney stone. He has had 1 stones prior to getting this one. He is not currently having flank pain, back pain, groin pain, nausea, vomiting, fever or chills. He has not caught a stone in his urine strainer since his symptoms began.   He has never had surgical treatment for calculi in the past.   Patient underwent a renal ultrasound on March 18, 2017 which showed a likely stone of 11 mm in the right lower pole and 19 mm in the left upper pole.   UA today clear. Jun 2018 bun 34, cr 1.61, PSA 0.37.       CC: I am having trouble with my erections.  HPI: He first stated noticing pain on approximately 03/04/1995. His symptoms did begin gradually. His symptoms have been worse over the last year.   He does have difficulties achieving an erection. He does have problems maintaining his erections. He has tried Viagra. It did work. He has tried Cialis. It did not work.     ALLERGIES: None   MEDICATIONS: Lisinopril 20 mg tablet  Viagra PRN  Amlodipine Besylate 5 mg tablet  Aspirin Ec 81 mg tablet, delayed release  Gabapentin 300 mg capsule  Glipizide 10 mg tablet  Lovastatin 40 mg tablet  Metformin Hcl Er 500 mg tablet, er gastric retention 24 hr  Temazepam 30 mg capsule  Tylenol Extra Strength 500 mg tablet PRN  Wellbutrin Sr 200 mg tablet, extended release 12 hr     GU PSH: None     PSH Notes: -removal of basal cell carcinoma from chest: 06/2015   NON-GU PSH: Cataract surgery, Bilateral    GU PMH: None   NON-GU PMH: Anxiety Basal cell carcinoma of skin, unspecified Depression Diabetes Type 2 GERD Hypercholesterolemia Hypertension Sleep Apnea    FAMILY HISTORY: Depression - Mother Diabetes - Brother, Father Kidney Stones - Father, Brother    Notes: 3 sons; 3 daughters    SOCIAL HISTORY: Marital Status: Single Preferred Language: English; Ethnicity: Not Hispanic Or Latino; Race: White Current Smoking Status: Patient has never smoked.   Tobacco Use Assessment Completed: Used Tobacco in last 30 days? Does not use smokeless tobacco. Has never drank.  Drinks 1 caffeinated drink per day. Patient's occupation is/was retired.    REVIEW OF SYSTEMS:    GU Review Male:   erections problems. Patient reports get up at night to urinate and leakage of urine. Patient denies frequent urination, hard to postpone urination, burning /pain with urination, stream starts and stops, trouble starting your stream, have to strain to urinate, and being pregnant.  Gastrointestinal (Upper):   Patient reports indigestion/ heartburn. Patient denies nausea and vomiting.  Gastrointestinal (Lower):   Patient denies diarrhea and constipation.  Constitutional:   Patient denies fever, night sweats, weight loss, and fatigue.  Skin:   Patient denies skin rash/ lesion and itching.  Eyes:   Patient denies blurred vision and double vision.  Ears/ Nose/ Throat:   Patient reports sinus problems. Patient denies sore throat.  Hematologic/Lymphatic:   Patient denies swollen glands and easy bruising.  Cardiovascular:   Patient denies leg swelling and chest pains.  Respiratory:   Patient denies cough and shortness of breath.  Endocrine:   Patient denies excessive thirst.  Musculoskeletal:   Patient  denies back pain and joint pain.  Neurological:   Patient denies headaches and dizziness.  Psychologic:   Patient reports depression. Patient denies anxiety.   VITAL SIGNS:      03/29/2017 02:22 PM  Weight 160 lb / 72.57 kg  Height 68 in / 172.72 cm  BP 132/87 mmHg  Pulse 96 /min  Temperature 97.7 F / 36.5 C  BMI 24.3 kg/m   MULTI-SYSTEM PHYSICAL EXAMINATION:    Constitutional: Well-nourished. No physical deformities. Normally developed. Good grooming.  Neck: Neck symmetrical, not swollen.  Normal tracheal position.  Respiratory: No labored breathing, no use of accessory muscles.   Cardiovascular: Normal temperature, normal extremity pulses, no swelling, no varicosities.  Lymphatic: No enlargement of neck, axillae, groin.  Skin: No paleness, no jaundice, no cyanosis. No lesion, no ulcer, no rash.  Neurologic / Psychiatric: Oriented to time, oriented to place, oriented to person. No depression, no anxiety, no agitation.  Gastrointestinal: No mass, no tenderness, no rigidity, non obese abdomen.  Eyes: Normal conjunctivae. Normal eyelids.  Ears, Nose, Mouth, and Throat: Left ear no scars, no lesions, no masses. Right ear no scars, no lesions, no masses. Nose no scars, no lesions, no masses. Normal hearing. Normal lips.  Musculoskeletal: Normal gait and station of head and neck.     PAST DATA REVIEWED:  Source Of History:  Patient  Records Review:   Previous Doctor Records  X-Ray Review: Renal Ultrasound: Reviewed Films.     PROCEDURES:         KUB - [id number]  A single view of the abdomen is obtained.  Bony Abnormalities:  none  Fecal Stasis:  Normal bowel gas pattern  Soft Tissue:  Unremarkable  Calculi:  15 mm right lower pole stone, 21 mm left upper pole stone               Urinalysis Dipstick Dipstick Cont'd  Color: Yellow Bilirubin: Neg  Appearance: Clear Ketones: Neg  Specific Gravity: 1.020 Blood: Neg  pH: <=5.0 Protein: Neg  Glucose: Neg Urobilinogen: 0.2    Nitrites: Neg    Leukocyte Esterase: Neg    ASSESSMENT:      ICD-10 Details  1 GU:   Renal calculus - N20.0   2   ED due to arterial insufficiency - N52.01    PLAN:           Orders X-Rays: KUB          Schedule Return Visit/Planned Activity: Next Available Appointment - Schedule Surgery          Document Letter(s):  Created for Patient: Clinical Summary         Notes:   kidney stones - I discussed with the patient the nature risks and benefits of continued surveillance, shockwave  lithotripsy, ureteroscopy and if stones get bigger PCNL. All questions answered. He elects to proceed with ESWL and we'll start with the left and once clear approach the right. In addition to the risks and benefits we discussed relative success rates as well as need for stage procedure given the large size of the stones. We also discussed one of my colleagues may be performing the procedure.   ED - we discussed the nature r/b of pde5i, injections, muse, VED. Discussed off label use of sildenafil 20 mg. He'll continue Viagra.   cc: Dr. Lysle Rubens

## 2017-05-02 DIAGNOSIS — N200 Calculus of kidney: Secondary | ICD-10-CM | POA: Diagnosis not present

## 2017-05-28 DIAGNOSIS — F322 Major depressive disorder, single episode, severe without psychotic features: Secondary | ICD-10-CM | POA: Diagnosis not present

## 2017-05-31 DIAGNOSIS — N200 Calculus of kidney: Secondary | ICD-10-CM | POA: Diagnosis not present

## 2017-06-07 DIAGNOSIS — N200 Calculus of kidney: Secondary | ICD-10-CM | POA: Diagnosis not present

## 2017-07-08 DIAGNOSIS — F332 Major depressive disorder, recurrent severe without psychotic features: Secondary | ICD-10-CM | POA: Diagnosis not present

## 2017-07-29 DIAGNOSIS — N200 Calculus of kidney: Secondary | ICD-10-CM | POA: Diagnosis not present

## 2017-07-31 DIAGNOSIS — H40013 Open angle with borderline findings, low risk, bilateral: Secondary | ICD-10-CM | POA: Diagnosis not present

## 2017-08-07 DIAGNOSIS — G4733 Obstructive sleep apnea (adult) (pediatric): Secondary | ICD-10-CM | POA: Diagnosis not present

## 2017-08-07 DIAGNOSIS — J309 Allergic rhinitis, unspecified: Secondary | ICD-10-CM | POA: Diagnosis not present

## 2017-08-07 DIAGNOSIS — I10 Essential (primary) hypertension: Secondary | ICD-10-CM | POA: Diagnosis not present

## 2017-08-07 DIAGNOSIS — N200 Calculus of kidney: Secondary | ICD-10-CM | POA: Diagnosis not present

## 2017-08-07 DIAGNOSIS — F322 Major depressive disorder, single episode, severe without psychotic features: Secondary | ICD-10-CM | POA: Diagnosis not present

## 2017-08-07 DIAGNOSIS — E1122 Type 2 diabetes mellitus with diabetic chronic kidney disease: Secondary | ICD-10-CM | POA: Diagnosis not present

## 2017-08-07 DIAGNOSIS — Z23 Encounter for immunization: Secondary | ICD-10-CM | POA: Diagnosis not present

## 2017-08-07 DIAGNOSIS — N183 Chronic kidney disease, stage 3 (moderate): Secondary | ICD-10-CM | POA: Diagnosis not present

## 2017-08-07 DIAGNOSIS — G4700 Insomnia, unspecified: Secondary | ICD-10-CM | POA: Diagnosis not present

## 2017-08-07 DIAGNOSIS — E7800 Pure hypercholesterolemia, unspecified: Secondary | ICD-10-CM | POA: Diagnosis not present

## 2017-09-03 HISTORY — PX: URETEROSCOPY: SHX842

## 2017-10-01 DIAGNOSIS — F322 Major depressive disorder, single episode, severe without psychotic features: Secondary | ICD-10-CM | POA: Diagnosis not present

## 2017-10-28 DIAGNOSIS — F332 Major depressive disorder, recurrent severe without psychotic features: Secondary | ICD-10-CM | POA: Diagnosis not present

## 2017-11-12 DIAGNOSIS — N200 Calculus of kidney: Secondary | ICD-10-CM | POA: Diagnosis not present

## 2017-11-25 ENCOUNTER — Other Ambulatory Visit: Payer: Self-pay | Admitting: Urology

## 2017-12-18 DIAGNOSIS — F322 Major depressive disorder, single episode, severe without psychotic features: Secondary | ICD-10-CM | POA: Diagnosis not present

## 2017-12-23 DIAGNOSIS — F332 Major depressive disorder, recurrent severe without psychotic features: Secondary | ICD-10-CM | POA: Diagnosis not present

## 2018-01-21 ENCOUNTER — Ambulatory Visit (HOSPITAL_BASED_OUTPATIENT_CLINIC_OR_DEPARTMENT_OTHER): Admit: 2018-01-21 | Payer: PPO | Admitting: Urology

## 2018-01-21 ENCOUNTER — Encounter (HOSPITAL_BASED_OUTPATIENT_CLINIC_OR_DEPARTMENT_OTHER): Payer: Self-pay

## 2018-01-21 SURGERY — CYSTOSCOPY/URETEROSCOPY/HOLMIUM LASER/STENT PLACEMENT
Anesthesia: General | Laterality: Bilateral

## 2018-01-28 DIAGNOSIS — H40013 Open angle with borderline findings, low risk, bilateral: Secondary | ICD-10-CM | POA: Diagnosis not present

## 2018-01-28 DIAGNOSIS — E119 Type 2 diabetes mellitus without complications: Secondary | ICD-10-CM | POA: Diagnosis not present

## 2018-01-28 DIAGNOSIS — H1859 Other hereditary corneal dystrophies: Secondary | ICD-10-CM | POA: Diagnosis not present

## 2018-01-28 DIAGNOSIS — H52203 Unspecified astigmatism, bilateral: Secondary | ICD-10-CM | POA: Diagnosis not present

## 2018-02-11 DIAGNOSIS — N200 Calculus of kidney: Secondary | ICD-10-CM | POA: Diagnosis not present

## 2018-02-11 DIAGNOSIS — R3915 Urgency of urination: Secondary | ICD-10-CM | POA: Diagnosis not present

## 2018-02-12 DIAGNOSIS — K219 Gastro-esophageal reflux disease without esophagitis: Secondary | ICD-10-CM | POA: Diagnosis not present

## 2018-02-12 DIAGNOSIS — Z0000 Encounter for general adult medical examination without abnormal findings: Secondary | ICD-10-CM | POA: Diagnosis not present

## 2018-02-12 DIAGNOSIS — F322 Major depressive disorder, single episode, severe without psychotic features: Secondary | ICD-10-CM | POA: Diagnosis not present

## 2018-02-12 DIAGNOSIS — J309 Allergic rhinitis, unspecified: Secondary | ICD-10-CM | POA: Diagnosis not present

## 2018-02-12 DIAGNOSIS — E1122 Type 2 diabetes mellitus with diabetic chronic kidney disease: Secondary | ICD-10-CM | POA: Diagnosis not present

## 2018-02-12 DIAGNOSIS — N183 Chronic kidney disease, stage 3 (moderate): Secondary | ICD-10-CM | POA: Diagnosis not present

## 2018-02-12 DIAGNOSIS — G4733 Obstructive sleep apnea (adult) (pediatric): Secondary | ICD-10-CM | POA: Diagnosis not present

## 2018-02-12 DIAGNOSIS — N200 Calculus of kidney: Secondary | ICD-10-CM | POA: Diagnosis not present

## 2018-02-12 DIAGNOSIS — G4700 Insomnia, unspecified: Secondary | ICD-10-CM | POA: Diagnosis not present

## 2018-02-12 DIAGNOSIS — E7800 Pure hypercholesterolemia, unspecified: Secondary | ICD-10-CM | POA: Diagnosis not present

## 2018-02-12 DIAGNOSIS — Z1389 Encounter for screening for other disorder: Secondary | ICD-10-CM | POA: Diagnosis not present

## 2018-02-12 DIAGNOSIS — I10 Essential (primary) hypertension: Secondary | ICD-10-CM | POA: Diagnosis not present

## 2018-02-20 DIAGNOSIS — F332 Major depressive disorder, recurrent severe without psychotic features: Secondary | ICD-10-CM | POA: Diagnosis not present

## 2018-02-24 DIAGNOSIS — F322 Major depressive disorder, single episode, severe without psychotic features: Secondary | ICD-10-CM | POA: Diagnosis not present

## 2018-02-24 DIAGNOSIS — I10 Essential (primary) hypertension: Secondary | ICD-10-CM | POA: Diagnosis not present

## 2018-03-04 DIAGNOSIS — C44319 Basal cell carcinoma of skin of other parts of face: Secondary | ICD-10-CM | POA: Diagnosis not present

## 2018-03-04 DIAGNOSIS — L814 Other melanin hyperpigmentation: Secondary | ICD-10-CM | POA: Diagnosis not present

## 2018-03-04 DIAGNOSIS — L718 Other rosacea: Secondary | ICD-10-CM | POA: Diagnosis not present

## 2018-03-04 DIAGNOSIS — D2339 Other benign neoplasm of skin of other parts of face: Secondary | ICD-10-CM | POA: Diagnosis not present

## 2018-03-04 DIAGNOSIS — D225 Melanocytic nevi of trunk: Secondary | ICD-10-CM | POA: Diagnosis not present

## 2018-03-04 DIAGNOSIS — D2239 Melanocytic nevi of other parts of face: Secondary | ICD-10-CM | POA: Diagnosis not present

## 2018-03-04 DIAGNOSIS — L821 Other seborrheic keratosis: Secondary | ICD-10-CM | POA: Diagnosis not present

## 2018-03-04 DIAGNOSIS — D485 Neoplasm of uncertain behavior of skin: Secondary | ICD-10-CM | POA: Diagnosis not present

## 2018-03-13 DIAGNOSIS — F332 Major depressive disorder, recurrent severe without psychotic features: Secondary | ICD-10-CM | POA: Diagnosis not present

## 2018-03-25 DIAGNOSIS — N201 Calculus of ureter: Secondary | ICD-10-CM | POA: Diagnosis not present

## 2018-03-25 DIAGNOSIS — N200 Calculus of kidney: Secondary | ICD-10-CM | POA: Diagnosis not present

## 2018-03-26 DIAGNOSIS — N200 Calculus of kidney: Secondary | ICD-10-CM | POA: Diagnosis not present

## 2018-04-01 ENCOUNTER — Other Ambulatory Visit: Payer: Self-pay | Admitting: Urology

## 2018-04-02 DIAGNOSIS — F322 Major depressive disorder, single episode, severe without psychotic features: Secondary | ICD-10-CM | POA: Diagnosis not present

## 2018-04-02 NOTE — Progress Notes (Signed)
Called patient and verified lithotripsy for Monday August 5, arrive at 0600 at admitting in [facility]. Patient instructed to bring blue folder and to make sure he does not take any medications on the list inside the blue folder. Nothing to eat or drink after midnight, sips of water with blood pressure and heart medication the morning of the procedure. Pt. Verified understanding.

## 2018-04-02 NOTE — Addendum Note (Signed)
Addended by: Link Snuffer D III on: 04/02/2018 10:26 AM   Modules accepted: Orders

## 2018-04-04 DIAGNOSIS — F332 Major depressive disorder, recurrent severe without psychotic features: Secondary | ICD-10-CM | POA: Diagnosis not present

## 2018-04-07 ENCOUNTER — Encounter (HOSPITAL_COMMUNITY)
Admission: RE | Disposition: A | Discharge status: Home / Self Care | Payer: Self-pay | Source: Ambulatory Visit | Attending: Urology

## 2018-04-07 ENCOUNTER — Encounter (HOSPITAL_COMMUNITY): Payer: Self-pay | Admitting: General Practice

## 2018-04-07 ENCOUNTER — Ambulatory Visit (HOSPITAL_COMMUNITY)
Admission: RE | Admit: 2018-04-07 | Discharge: 2018-04-07 | Disposition: A | Discharge status: Home / Self Care | Payer: PPO | Source: Ambulatory Visit | Attending: Urology | Admitting: Urology

## 2018-04-07 ENCOUNTER — Ambulatory Visit (HOSPITAL_COMMUNITY): Payer: PPO

## 2018-04-07 DIAGNOSIS — N400 Enlarged prostate without lower urinary tract symptoms: Secondary | ICD-10-CM | POA: Insufficient documentation

## 2018-04-07 DIAGNOSIS — Z85828 Personal history of other malignant neoplasm of skin: Secondary | ICD-10-CM | POA: Diagnosis not present

## 2018-04-07 DIAGNOSIS — N200 Calculus of kidney: Secondary | ICD-10-CM | POA: Diagnosis not present

## 2018-04-07 DIAGNOSIS — I10 Essential (primary) hypertension: Secondary | ICD-10-CM | POA: Diagnosis not present

## 2018-04-07 DIAGNOSIS — Z7982 Long term (current) use of aspirin: Secondary | ICD-10-CM | POA: Diagnosis not present

## 2018-04-07 DIAGNOSIS — E119 Type 2 diabetes mellitus without complications: Secondary | ICD-10-CM | POA: Insufficient documentation

## 2018-04-07 DIAGNOSIS — F329 Major depressive disorder, single episode, unspecified: Secondary | ICD-10-CM | POA: Insufficient documentation

## 2018-04-07 DIAGNOSIS — Z79899 Other long term (current) drug therapy: Secondary | ICD-10-CM | POA: Insufficient documentation

## 2018-04-07 DIAGNOSIS — G4730 Sleep apnea, unspecified: Secondary | ICD-10-CM | POA: Insufficient documentation

## 2018-04-07 DIAGNOSIS — E7800 Pure hypercholesterolemia, unspecified: Secondary | ICD-10-CM | POA: Insufficient documentation

## 2018-04-07 DIAGNOSIS — Z7984 Long term (current) use of oral hypoglycemic drugs: Secondary | ICD-10-CM | POA: Diagnosis not present

## 2018-04-07 HISTORY — PX: EXTRACORPOREAL SHOCK WAVE LITHOTRIPSY: SHX1557

## 2018-04-07 LAB — GLUCOSE, CAPILLARY: Glucose-Capillary: 149 mg/dL — ABNORMAL HIGH (ref 70–99)

## 2018-04-07 SURGERY — LITHOTRIPSY, ESWL
Anesthesia: LOCAL | Laterality: Right

## 2018-04-07 MED ORDER — DIAZEPAM 5 MG PO TABS
10.0000 mg | ORAL_TABLET | ORAL | Status: AC
  Administered 2018-04-07: 10 mg via ORAL
  Filled 2018-04-07: qty 2

## 2018-04-07 MED ORDER — DIPHENHYDRAMINE HCL 25 MG PO CAPS
25.0000 mg | ORAL_CAPSULE | ORAL | Status: AC
  Administered 2018-04-07: 25 mg via ORAL
  Filled 2018-04-07: qty 1

## 2018-04-07 MED ORDER — SODIUM CHLORIDE 0.9 % IV SOLN
INTRAVENOUS | Status: DC
  Administered 2018-04-07: 07:00:00 via INTRAVENOUS

## 2018-04-07 MED ORDER — CIPROFLOXACIN HCL 500 MG PO TABS
500.0000 mg | ORAL_TABLET | ORAL | Status: AC
  Administered 2018-04-07: 500 mg via ORAL
  Filled 2018-04-07: qty 1

## 2018-04-07 NOTE — Op Note (Signed)
See scanned op note for ESWL

## 2018-04-07 NOTE — H&P (Signed)
See scanned H&P

## 2018-04-15 ENCOUNTER — Other Ambulatory Visit: Payer: Self-pay | Admitting: Urology

## 2018-04-15 DIAGNOSIS — N200 Calculus of kidney: Secondary | ICD-10-CM | POA: Diagnosis not present

## 2018-04-24 DIAGNOSIS — F332 Major depressive disorder, recurrent severe without psychotic features: Secondary | ICD-10-CM | POA: Diagnosis not present

## 2018-04-24 DIAGNOSIS — F322 Major depressive disorder, single episode, severe without psychotic features: Secondary | ICD-10-CM | POA: Diagnosis not present

## 2018-04-25 ENCOUNTER — Other Ambulatory Visit: Payer: Self-pay | Admitting: Urology

## 2018-04-28 ENCOUNTER — Encounter (HOSPITAL_BASED_OUTPATIENT_CLINIC_OR_DEPARTMENT_OTHER): Payer: Self-pay | Admitting: *Deleted

## 2018-04-28 ENCOUNTER — Other Ambulatory Visit: Payer: Self-pay

## 2018-04-28 NOTE — Progress Notes (Signed)
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

## 2018-05-01 NOTE — H&P (Signed)
Office Visit Report     04/15/2018   --------------------------------------------------------------------------------   Blake Young  MRN: [id number]  PRIMARY CARE:  Wenda Low, MD  DOB: 1950/11/25, 67 year old Male  REFERRING:  Wenda Low, MD  SSN: -[id number]  PROVIDER:  Festus Aloe, M.D.    LOCATION:  [facility] [phone]   --------------------------------------------------------------------------------   CC: I have kidney stones.  HPI: Blake Young is a 67 year-old male established patient who is here for renal calculi.  The problem is on both sides. This is not his first kidney stone. He has had 1 stones prior to getting this one. He is not currently having flank pain, back pain, groin pain, nausea, vomiting, fever or chills. He has not caught a stone in his urine strainer since his symptoms began.   He has had eswl for treatment of his stones in the past.   Jun 2018 bun 34, cr 1.61, PSA 0.37.   Left ESWL Aug 2018 for a LUP stone but fragments spread out along a 3.2 cm path and didn't pass. F/u KUB 13 mm fragments and a 13 mm fragment. CT with three LUP stone now - 10 mm, 7 mm, 8 mm (HU ~ 800). RLP stone 8 mm (HU 1260, visible, 11 cm SSD).   He underwent bilateral URS/HLL/stent 7/24. Partial fragmentation and clearance of left upper pole stones. Difficult access to right lower pole.   He underwent shockwave of the right lower pole stone (prior CT 10 cm ssd, hu 1250) April 07, 2018. KUB today shows excellent clearance of stones from both kidneys -- about 90% of the stone. He continues to have some frequency and urgency from the stents. No dysuria or fever.     ALLERGIES: tamsulosin - Other Reaction, low blood pressure    MEDICATIONS: Lisinopril 20 mg tablet  Viagra PRN  Alfuzosin Hcl Er 10 mg tablet, extended release 24 hr 1 tablet PO Daily  Amlodipine Besylate 5 mg tablet  Claritin 10 mg tablet  Gabapentin 300 mg capsule  Lovastatin 40 mg  tablet  Metformin Er Gastric 500 mg tablet, er gastric retention 24 hr  Ondansetron Hcl  Tylenol Extra Strength 500 mg tablet PRN  Wellbutrin Sr 200 mg tablet,sustained-release 12 hr  Zaleplon     GU PSH: ESWL, Right - 04/07/2018, 04/18/2017 Ureteroscopic laser litho, Bilateral - 03/25/2018      PSH Notes: -removal of basal cell carcinoma from chest: 06/2015   NON-GU PSH: Cataract surgery, Bilateral    GU PMH: Renal calculus - 03/26/2018, - 02/11/2018, - 11/12/2017, - 07/29/2017, - 03/29/2017 BPH w/LUTS - 02/11/2018 Incomplete bladder emptying - 02/11/2018 Urinary Urgency - 02/11/2018 ED due to arterial insufficiency - 03/29/2017    NON-GU PMH: Anxiety Basal cell carcinoma of skin, unspecified Depression Diabetes Type 2 GERD Hypercholesterolemia Hypertension Sleep Apnea    FAMILY HISTORY: Depression - Mother Diabetes - Brother, Father Kidney Stones - Father, Brother    Notes: 3 sons; 3 daughters   SOCIAL HISTORY: Marital Status: Single Preferred Language: English; Ethnicity: Not Hispanic Or Latino; Race: White Current Smoking Status: Patient has never smoked.   Tobacco Use Assessment Completed: Used Tobacco in last 30 days? Does not use smokeless tobacco. Has never drank.  Drinks 1 caffeinated drink per day. Patient's occupation is/was retired.    REVIEW OF SYSTEMS:    GU Review Male:   Patient reports burning/ pain with urination. Patient denies frequent urination, hard to postpone urination, get up at night to  urinate, leakage of urine, stream starts and stops, trouble starting your stream, have to strain to urinate , erection problems, and penile pain.  Gastrointestinal (Upper):   Patient denies nausea, vomiting, and indigestion/ heartburn.  Gastrointestinal (Lower):   Patient denies diarrhea and constipation.  Constitutional:   Patient denies fever, night sweats, weight loss, and fatigue.  Skin:   Patient denies skin rash/ lesion and itching.  Eyes:   Patient denies  blurred vision and double vision.  Ears/ Nose/ Throat:   Patient denies sore throat and sinus problems.  Hematologic/Lymphatic:   Patient denies swollen glands and easy bruising.  Cardiovascular:   Patient denies leg swelling and chest pains.  Respiratory:   Patient denies cough and shortness of breath.  Endocrine:   Patient denies excessive thirst.  Musculoskeletal:   Patient reports back pain. Patient denies joint pain.  Neurological:   Patient denies headaches and dizziness.  Psychologic:   Patient denies depression and anxiety.   VITAL SIGNS:      04/15/2018 10:26 AM  BP 118/83 mmHg  Pulse 102 /min  Temperature 97.6 F / 36.4 C   MULTI-SYSTEM PHYSICAL EXAMINATION:    Constitutional: Well-nourished. No physical deformities. Normally developed. Good grooming.  Neck: Neck symmetrical, not swollen. Normal tracheal position.  Respiratory: No labored breathing, no use of accessory muscles.   Cardiovascular: Normal temperature, normal extremity pulses, no swelling, no varicosities.  Skin: No paleness, no jaundice, no cyanosis. No lesion, no ulcer, no rash.  Neurologic / Psychiatric: Oriented to time, oriented to place, oriented to person. No depression, no anxiety, no agitation.  Gastrointestinal: No mass, no tenderness, no rigidity, non obese abdomen.     PAST DATA REVIEWED:  Source Of History:  Patient  X-Ray Review: KUB: Reviewed Films. prior KUB's     PROCEDURES:         KUB - [id number]  A single view of the abdomen is obtained.  Calculi:  Clearance of most of the stone. Several small 2-3 mm fragments in the right lower pole in the left upper pole.   Ureteral Stent:  Bilateral ureteral stent.      The bones appeared normal. The bowel gas pattern appeared normal. The soft tissues were unremarkable.         Urinalysis w/Scope Dipstick Dipstick Cont'd Micro  Color: Yellow Bilirubin: Neg mg/dL WBC/hpf: 10 - 20/hpf  Appearance: Cloudy Ketones: Neg mg/dL RBC/hpf: 3 - 10/hpf   Specific Gravity: 1.015 Blood: 3+ ery/uL Bacteria: Mod (26-50/hpf)  pH: <=5.0 Protein: 2+ mg/dL Cystals: NS (Not Seen)  Glucose: Neg mg/dL Urobilinogen: 0.2 mg/dL Casts: NS (Not Seen)    Nitrites: Neg Trichomonas: Not Present    Leukocyte Esterase: 1+ leu/uL Mucous: Not Present      Epithelial Cells: 0 - 5/hpf      Yeast: NS (Not Seen)      Sperm: Not Present    ASSESSMENT:      ICD-10 Details  1 GU:   Renal calculus - N20.0    PLAN:            Medications Stop Meds: Hydrocodone-Acetaminophen 5 mg-325 mg tablet 1-2 tablet PO Q 6 H  Start: 03/26/2018  Discontinue: 04/01/2018  - Reason: The medication cycle was completed.            Orders Labs Urine Culture          Schedule Return Visit/Planned Activity: Next Available Appointment - Schedule Surgery          Document  Letter(s):  Created for Patient: Clinical Summary         Notes:   Kidney stones-I showed him the KUB and compared to the prior. We discussed the nature risk and benefits of simply removing the stents today which I was comfortable with or proceeding with a final ureteroscopy to check for any of those larger fragments. All questions answered and patient would like to proceed with ureteroscopy. I did send urine for culture.          Next Appointment:      Next Appointment: 05/02/2018 09:15 AM    Appointment Type: Surgery     Location: [facility] [phone]    Provider: Festus Aloe, M.D.    Reason for Visit: NE/OP CYSTO, BIL URS, HLL, STENT      * Signed by Festus Aloe, M.D. on 04/15/18 at 4:38 PM (EDT)*     The information contained in this medical record document is considered private and confidential patient information. This information can only be used for the medical diagnosis and/or medical services that are being provided by the patient's selected caregivers. This information can only be distributed outside of the patient's care if the patient agrees and signs  waivers of authorization for this information to be sent to an outside source or route.

## 2018-05-02 ENCOUNTER — Encounter (HOSPITAL_BASED_OUTPATIENT_CLINIC_OR_DEPARTMENT_OTHER): Payer: Self-pay | Admitting: Anesthesiology

## 2018-05-02 ENCOUNTER — Ambulatory Visit (HOSPITAL_BASED_OUTPATIENT_CLINIC_OR_DEPARTMENT_OTHER)
Admission: RE | Admit: 2018-05-02 | Discharge: 2018-05-02 | Disposition: A | Discharge status: Home / Self Care | Payer: PPO | Source: Ambulatory Visit | Attending: Urology | Admitting: Urology

## 2018-05-02 ENCOUNTER — Ambulatory Visit (HOSPITAL_BASED_OUTPATIENT_CLINIC_OR_DEPARTMENT_OTHER): Payer: PPO | Admitting: Anesthesiology

## 2018-05-02 ENCOUNTER — Encounter (HOSPITAL_BASED_OUTPATIENT_CLINIC_OR_DEPARTMENT_OTHER)
Admission: RE | Disposition: A | Discharge status: Home / Self Care | Payer: Self-pay | Source: Ambulatory Visit | Attending: Urology

## 2018-05-02 ENCOUNTER — Ambulatory Visit (HOSPITAL_COMMUNITY): Payer: PPO

## 2018-05-02 ENCOUNTER — Other Ambulatory Visit: Payer: Self-pay

## 2018-05-02 DIAGNOSIS — K219 Gastro-esophageal reflux disease without esophagitis: Secondary | ICD-10-CM | POA: Insufficient documentation

## 2018-05-02 DIAGNOSIS — I10 Essential (primary) hypertension: Secondary | ICD-10-CM | POA: Diagnosis not present

## 2018-05-02 DIAGNOSIS — F329 Major depressive disorder, single episode, unspecified: Secondary | ICD-10-CM | POA: Insufficient documentation

## 2018-05-02 DIAGNOSIS — G4730 Sleep apnea, unspecified: Secondary | ICD-10-CM | POA: Diagnosis not present

## 2018-05-02 DIAGNOSIS — F419 Anxiety disorder, unspecified: Secondary | ICD-10-CM | POA: Insufficient documentation

## 2018-05-02 DIAGNOSIS — N200 Calculus of kidney: Secondary | ICD-10-CM | POA: Insufficient documentation

## 2018-05-02 DIAGNOSIS — R3914 Feeling of incomplete bladder emptying: Secondary | ICD-10-CM | POA: Insufficient documentation

## 2018-05-02 DIAGNOSIS — Z466 Encounter for fitting and adjustment of urinary device: Secondary | ICD-10-CM | POA: Diagnosis not present

## 2018-05-02 DIAGNOSIS — N210 Calculus in bladder: Secondary | ICD-10-CM | POA: Diagnosis not present

## 2018-05-02 DIAGNOSIS — R3915 Urgency of urination: Secondary | ICD-10-CM | POA: Insufficient documentation

## 2018-05-02 DIAGNOSIS — E7800 Pure hypercholesterolemia, unspecified: Secondary | ICD-10-CM | POA: Diagnosis not present

## 2018-05-02 DIAGNOSIS — N401 Enlarged prostate with lower urinary tract symptoms: Secondary | ICD-10-CM | POA: Insufficient documentation

## 2018-05-02 DIAGNOSIS — N202 Calculus of kidney with calculus of ureter: Secondary | ICD-10-CM | POA: Diagnosis not present

## 2018-05-02 DIAGNOSIS — Z7984 Long term (current) use of oral hypoglycemic drugs: Secondary | ICD-10-CM | POA: Insufficient documentation

## 2018-05-02 DIAGNOSIS — E119 Type 2 diabetes mellitus without complications: Secondary | ICD-10-CM | POA: Insufficient documentation

## 2018-05-02 HISTORY — PX: CYSTOSCOPY/URETEROSCOPY/HOLMIUM LASER/STENT PLACEMENT: SHX6546

## 2018-05-02 LAB — POCT I-STAT 4, (NA,K, GLUC, HGB,HCT)
Glucose, Bld: 164 mg/dL — ABNORMAL HIGH (ref 70–99)
HCT: 35 % — ABNORMAL LOW (ref 39.0–52.0)
Hemoglobin: 11.9 g/dL — ABNORMAL LOW (ref 13.0–17.0)
Potassium: 3.9 mmol/L (ref 3.5–5.1)
Sodium: 142 mmol/L (ref 135–145)

## 2018-05-02 LAB — GLUCOSE, CAPILLARY: Glucose-Capillary: 142 mg/dL — ABNORMAL HIGH (ref 70–99)

## 2018-05-02 SURGERY — CYSTOSCOPY/URETEROSCOPY/HOLMIUM LASER/STENT PLACEMENT
Anesthesia: General | Laterality: Bilateral

## 2018-05-02 MED ORDER — LIDOCAINE 2% (20 MG/ML) 5 ML SYRINGE
INTRAMUSCULAR | Status: DC | PRN
  Administered 2018-05-02: 60 mg via INTRAVENOUS

## 2018-05-02 MED ORDER — FUROSEMIDE 10 MG/ML IJ SOLN
INTRAMUSCULAR | Status: AC
  Filled 2018-05-02: qty 2

## 2018-05-02 MED ORDER — PROPOFOL 10 MG/ML IV BOLUS
INTRAVENOUS | Status: DC | PRN
  Administered 2018-05-02: 150 mg via INTRAVENOUS

## 2018-05-02 MED ORDER — PROPOFOL 10 MG/ML IV BOLUS
INTRAVENOUS | Status: AC
  Filled 2018-05-02: qty 20

## 2018-05-02 MED ORDER — OXYCODONE-ACETAMINOPHEN 5-325 MG PO TABS: 1.0000 | ORAL_TABLET | Freq: Four times a day (QID) | ORAL | 0 refills | Status: DC | PRN

## 2018-05-02 MED ORDER — OXYCODONE HCL 5 MG PO TABS
5.0000 mg | ORAL_TABLET | Freq: Once | ORAL | Status: DC | PRN
  Filled 2018-05-02: qty 1

## 2018-05-02 MED ORDER — DEXAMETHASONE SODIUM PHOSPHATE 10 MG/ML IJ SOLN
INTRAMUSCULAR | Status: DC | PRN
  Administered 2018-05-02: 10 mg via INTRAVENOUS

## 2018-05-02 MED ORDER — LACTATED RINGERS IV SOLN
INTRAVENOUS | Status: DC
  Administered 2018-05-02: 1000 mL via INTRAVENOUS
  Administered 2018-05-02: 10:00:00 via INTRAVENOUS
  Filled 2018-05-02: qty 1000

## 2018-05-02 MED ORDER — FENTANYL CITRATE (PF) 100 MCG/2ML IJ SOLN
INTRAMUSCULAR | Status: DC | PRN
  Administered 2018-05-02: 50 ug via INTRAVENOUS

## 2018-05-02 MED ORDER — ONDANSETRON HCL 4 MG PO TABS: 4.0000 mg | ORAL_TABLET | Freq: Three times a day (TID) | ORAL | 1 refills | Status: AC | PRN

## 2018-05-02 MED ORDER — LACTATED RINGERS IV SOLN
INTRAVENOUS | Status: DC
  Filled 2018-05-02: qty 1000

## 2018-05-02 MED ORDER — DEXAMETHASONE SODIUM PHOSPHATE 10 MG/ML IJ SOLN
INTRAMUSCULAR | Status: AC
  Filled 2018-05-02: qty 1

## 2018-05-02 MED ORDER — FUROSEMIDE 10 MG/ML IJ SOLN
INTRAMUSCULAR | Status: DC | PRN
  Administered 2018-05-02: 10 mg via INTRAMUSCULAR

## 2018-05-02 MED ORDER — LIDOCAINE HCL URETHRAL/MUCOSAL 2 % EX GEL
CUTANEOUS | Status: DC | PRN
  Administered 2018-05-02: 1

## 2018-05-02 MED ORDER — ONDANSETRON HCL 4 MG/2ML IJ SOLN
INTRAMUSCULAR | Status: AC
  Filled 2018-05-02: qty 2

## 2018-05-02 MED ORDER — KETOROLAC TROMETHAMINE 30 MG/ML IJ SOLN
INTRAMUSCULAR | Status: AC
  Filled 2018-05-02: qty 1

## 2018-05-02 MED ORDER — MIDAZOLAM HCL 5 MG/5ML IJ SOLN
INTRAMUSCULAR | Status: DC | PRN
  Administered 2018-05-02: 1 mg via INTRAVENOUS

## 2018-05-02 MED ORDER — PROMETHAZINE HCL 25 MG/ML IJ SOLN
6.2500 mg | INTRAMUSCULAR | Status: DC | PRN
  Filled 2018-05-02: qty 1

## 2018-05-02 MED ORDER — SODIUM CHLORIDE 0.9 % IR SOLN
Status: DC | PRN
  Administered 2018-05-02: 6000 mL via INTRAVESICAL

## 2018-05-02 MED ORDER — LIDOCAINE 2% (20 MG/ML) 5 ML SYRINGE
INTRAMUSCULAR | Status: AC
  Filled 2018-05-02: qty 5

## 2018-05-02 MED ORDER — MIDAZOLAM HCL 2 MG/2ML IJ SOLN
INTRAMUSCULAR | Status: AC
  Filled 2018-05-02: qty 2

## 2018-05-02 MED ORDER — MEPERIDINE HCL 25 MG/ML IJ SOLN
6.2500 mg | INTRAMUSCULAR | Status: DC | PRN
  Filled 2018-05-02: qty 1

## 2018-05-02 MED ORDER — PHENYLEPHRINE 40 MCG/ML (10ML) SYRINGE FOR IV PUSH (FOR BLOOD PRESSURE SUPPORT)
PREFILLED_SYRINGE | INTRAVENOUS | Status: DC | PRN
  Administered 2018-05-02 (×2): 40 ug via INTRAVENOUS
  Administered 2018-05-02 (×3): 80 ug via INTRAVENOUS
  Administered 2018-05-02: 160 ug via INTRAVENOUS

## 2018-05-02 MED ORDER — KETOROLAC TROMETHAMINE 30 MG/ML IJ SOLN
INTRAMUSCULAR | Status: DC | PRN
  Administered 2018-05-02: 15 mg via INTRAVENOUS

## 2018-05-02 MED ORDER — OXYCODONE HCL 5 MG/5ML PO SOLN
5.0000 mg | Freq: Once | ORAL | Status: DC | PRN
  Filled 2018-05-02: qty 5

## 2018-05-02 MED ORDER — CEFAZOLIN SODIUM-DEXTROSE 2-4 GM/100ML-% IV SOLN
INTRAVENOUS | Status: AC
  Filled 2018-05-02: qty 100

## 2018-05-02 MED ORDER — CEFAZOLIN SODIUM-DEXTROSE 2-4 GM/100ML-% IV SOLN
2.0000 g | Freq: Once | INTRAVENOUS | Status: AC
  Administered 2018-05-02: 2 g via INTRAVENOUS
  Filled 2018-05-02: qty 100

## 2018-05-02 MED ORDER — FENTANYL CITRATE (PF) 100 MCG/2ML IJ SOLN
25.0000 ug | INTRAMUSCULAR | Status: DC | PRN
  Filled 2018-05-02: qty 1

## 2018-05-02 MED ORDER — ONDANSETRON HCL 4 MG/2ML IJ SOLN
INTRAMUSCULAR | Status: DC | PRN
  Administered 2018-05-02: 4 mg via INTRAVENOUS

## 2018-05-02 MED ORDER — FENTANYL CITRATE (PF) 100 MCG/2ML IJ SOLN
INTRAMUSCULAR | Status: AC
  Filled 2018-05-02: qty 2

## 2018-05-02 MED ORDER — IOHEXOL 300 MG/ML  SOLN
INTRAMUSCULAR | Status: DC | PRN
  Administered 2018-05-02: 9 mL

## 2018-05-02 MED ORDER — PHENYLEPHRINE 40 MCG/ML (10ML) SYRINGE FOR IV PUSH (FOR BLOOD PRESSURE SUPPORT)
PREFILLED_SYRINGE | INTRAVENOUS | Status: AC
  Filled 2018-05-02: qty 10

## 2018-05-02 SURGICAL SUPPLY — 20 items
BAG DRAIN URO-CYSTO SKYTR STRL (DRAIN) ×2 IMPLANT
CLOTH BEACON ORANGE TIMEOUT ST (SAFETY) ×2 IMPLANT
GLOVE BIO SURGEON STRL SZ 6.5 (GLOVE) ×2 IMPLANT
GLOVE BIO SURGEON STRL SZ7.5 (GLOVE) ×2 IMPLANT
GLOVE BIOGEL PI IND STRL 6.5 (GLOVE) ×1 IMPLANT
GLOVE BIOGEL PI INDICATOR 6.5 (GLOVE) ×1
GOWN STRL REUS W/TWL LRG LVL3 (GOWN DISPOSABLE) ×2 IMPLANT
GOWN STRL REUS W/TWL XL LVL3 (GOWN DISPOSABLE) ×2 IMPLANT
GUIDEWIRE ANG ZIPWIRE 038X150 (WIRE) ×4 IMPLANT
GUIDEWIRE STR DUAL SENSOR (WIRE) ×2 IMPLANT
IV NS IRRIG 3000ML ARTHROMATIC (IV SOLUTION) ×4 IMPLANT
KIT TURNOVER CYSTO (KITS) ×2 IMPLANT
MANIFOLD NEPTUNE II (INSTRUMENTS) ×2 IMPLANT
NS IRRIG 1000ML POUR BTL (IV SOLUTION) ×2 IMPLANT
NS IRRIG 500ML POUR BTL (IV SOLUTION) ×2 IMPLANT
PACK CYSTO (CUSTOM PROCEDURE TRAY) ×2 IMPLANT
SHEATH URETERAL 12FRX35CM (MISCELLANEOUS) ×2 IMPLANT
STENT URET 6FRX24 CONTOUR (STENTS) ×4 IMPLANT
TUBE CONNECTING 12X1/4 (SUCTIONS) ×2 IMPLANT
TUBING UROLOGY SET (TUBING) ×2 IMPLANT

## 2018-05-02 NOTE — Op Note (Signed)
Preoperative diagnosis: Bilateral renal stones Postoperative diagnosis: Bilateral renal stones, bladder stones  Procedure: Cystoscopy with stone fragment removal from the bladder, bilateral ureteroscopy with stone basket extraction, right retrograde pyelogram, bilateral ureteral stent exchange  Surgeon: Junious Silk  Anesthesia: General  Indication for procedure: 67 year old with bilateral renal stones unable to clear the left proximal with ureteroscopy nor the right lower pole stone.  Right lower pole stone underwent shockwave lithotripsy and he was brought back for a second look ureteroscopy of the systems today.  Findings: In the left system there were a couple of fragments in the left upper pole which were grasped and removed.  Otherwise the infundibulum and collecting system all appeared normal and no other significant stones or stone fragments.  On the way out the ureter was normal without stone fragment or injury but there was quite a bit of debris and edema and I exchange the stent on the side.  On right ureteroscopy a 3 mm fragment was extracted from the right lower pole and removed.  Otherwise no other significant stone fragment.  To ensure all calyces were inspected I injected contrast through the scope and inspected the collecting system.  On the way out the renal pelvis, UPJ and ureter were all inspected and noted to be normal with just mild edema.  I was hoping to not leave a stent on the side, but contrast did not drain after 10 minutes and IV Lasix therefore exchanged stent on the side.  Right retrograde pyelogram- this outlined mild dilation of the right renal pelvis and normal calyces without filling defect.  Mild hydronephrosis that did not drain after 10 minutes and 10 mg of IV Lasix.  The hang up was at the UPJ.  Description of procedure: After consent was obtained patient brought to the operating room.  After adequate anesthesia was placed in lithotomy position and prepped and  draped in the usual sterile fashion.  A timeout was performed to confirm the patient and procedure.  The cystoscope was passed per urethra and the left ureteral stent grasped and removed through the urethral meatus.  I could not get the sensor wire through the stent and therefore it was removed.  I repassed the cystoscope and passed the sensor wire up the left ureteral orifice.  An access sheath was used to place 2 wires and I went adjacent to the Glidewire and then passed the dual channel digital ureteroscope into the kidney and inspected all the calyces.  In the upper pole there was a stone fragment about 3 mm remaining and it was grasped with the basket and removed.  No other significant stone fragments were noted and the collecting system renal pelvis and ureter were inspected on the way out as I backed the ureteroscope out with the sheath.  I then turned my attention back with the cystoscope to the right ureteral orifice and the right stent was grasped and removed through the urethral meatus.  A sensor wire was advanced and I used the access sheath to get 2 wires up.  I went adjacent to the Glidewire and passed a dual channel digital ureteroscope and inspected the kidney and found a stone in the right lower pole this was grasped with the basket and removed.  No other significant stones were noted.  Wanted to make sure I inspected all of the lower pole and injected 10 cc of contrast to the scope to perform a right retrograde pyelogram.  All calyces were inspected and the scope removed.  The cystoscope was repassed after back loading the left Glidewire and a 6 x 24 cm stent was advanced with a good coil seen in the kidney and a good coil in the bladder.  I left a string on the stent.  We turned our attention to the right side and none of the contrast seemed to drain.  He was given 10 mg of IV Lasix we observed for 10 minutes without drainage.  I passed the wire up and seemed to get better drainage.  This I  will come around but I decided to pass the Glidewire back up which I did and then a 6 x 24 cm stent on the right.  The wire was removed with a good coil seen in the kidney and a good coil in the bladder.  The bladder had several fragments in it that he had passed around the stent.  These were grasped and removed with the 0 tip basket.  Final inspection noted the stents to be in good inspection under scout fluoroscopy.  The bladder was drained and the scope removed.  The strings were taped together and then taped to the patient.  I placed some lidocaine jelly per urethra.  He was awakened taken to recovery in stable condition.  Complications: None  Blood loss: Minimal  Specimen: Stone fragments to patient/office lab  Drains: 6 x 24 cm bilateral ureteral stent with strings  Disposition: Patient stable to PACU

## 2018-05-02 NOTE — Transfer of Care (Signed)
Immediate Anesthesia Transfer of Care Note  Patient: Blake Young.  Procedure(s) Performed: CYSTOSCOPY/URETEROSCOPY/RIGHT RETROGRADE/BILATERAL STENT REMOVAL/STENT PLACEMENT LEFT URETER (Bilateral )  Patient Location: PACU  Anesthesia Type:General  Level of Consciousness: awake, alert  and oriented  Airway & Oxygen Therapy: Patient Spontanous Breathing and Patient connected to nasal cannula oxygen  Post-op Assessment: Report given to RN  Post vital signs: Reviewed and stable  Last Vitals:  Vitals Value Taken Time  BP 136/81 05/02/2018 10:13 AM  Temp    Pulse 92 05/02/2018 10:15 AM  Resp 6 05/02/2018 10:15 AM  SpO2 100 % 05/02/2018 10:15 AM  Vitals shown include unvalidated device data.  Last Pain:  Vitals:   05/02/18 0739  TempSrc:   PainSc: 4       Patients Stated Pain Goal: 6 (09/81/19 1478)  Complications: No apparent anesthesia complications

## 2018-05-02 NOTE — Anesthesia Postprocedure Evaluation (Signed)
Anesthesia Post Note  Patient: Blake Young.  Procedure(s) Performed: CYSTOSCOPY/URETEROSCOPY/RIGHT RETROGRADE/BILATERAL STENT REMOVAL/STENT PLACEMENT LEFT URETER (Bilateral )     Patient location during evaluation: PACU Anesthesia Type: General Level of consciousness: awake and alert Pain management: pain level controlled Vital Signs Assessment: post-procedure vital signs reviewed and stable Respiratory status: spontaneous breathing, nonlabored ventilation, respiratory function stable and patient connected to nasal cannula oxygen Cardiovascular status: blood pressure returned to baseline and stable Postop Assessment: no apparent nausea or vomiting Anesthetic complications: no    Last Vitals:  Vitals:   05/02/18 1030 05/02/18 1045  BP: 135/82 (!) 143/87  Pulse: 87 93  Resp: (!) 7 12  Temp:    SpO2: 100% 99%    Last Pain:  Vitals:   05/02/18 1100  TempSrc:   PainSc: 0-No pain                 Effie Berkshire

## 2018-05-02 NOTE — Anesthesia Procedure Notes (Signed)
Procedure Name: LMA Insertion Date/Time: 05/02/2018 8:52 AM Performed by: Bonney Aid, CRNA Pre-anesthesia Checklist: Patient identified, Emergency Drugs available, Suction available and Patient being monitored Patient Re-evaluated:Patient Re-evaluated prior to induction Oxygen Delivery Method: Circle system utilized Preoxygenation: Pre-oxygenation with 100% oxygen Induction Type: IV induction Ventilation: Mask ventilation without difficulty LMA: LMA inserted LMA Size: 4.0 Number of attempts: 1 Airway Equipment and Method: Bite block Placement Confirmation: positive ETCO2 Tube secured with: Tape Dental Injury: Teeth and Oropharynx as per pre-operative assessment

## 2018-05-02 NOTE — Discharge Instructions (Signed)
Post Anesthesia Home Care Instructions  Activity: Get plenty of rest for the remainder of the day. A responsible individual must stay with you for 24 hours following the procedure.  For the next 24 hours, DO NOT: -Drive a car -Paediatric nurse -Drink alcoholic beverages -Take any medication unless instructed by your physician -Make any legal decisions or sign important papers.  Meals: Start with liquid foods such as gelatin or soup. Progress to regular foods as tolerated. Avoid greasy, spicy, heavy foods. If nausea and/or vomiting occur, drink only clear liquids until the nausea and/or vomiting subsides. Call your physician if vomiting continues.  Special Instructions/Symptoms: Your throat may feel dry or sore from the anesthesia or the breathing tube placed in your throat during surgery. If this causes discomfort, gargle with warm salt water. The discomfort should disappear within 24 hours.  If you had a scopolamine patch placed behind your ear for the management of post- operative nausea and/or vomiting:  1. The medication in the patch is effective for 72 hours, after which it should be removed.  Wrap patch in a tissue and discard in the trash. Wash hands thoroughly with soap and water. 2. You may remove the patch earlier than 72 hours if you experience unpleasant side effects which may include dry mouth, dizziness or visual disturbances. 3. Avoid touching the patch. Wash your hands with soap and water after contact with the patch.   Ureteral Stent Implantation, Care After Refer to this sheet in the next few weeks. These instructions provide you with information about caring for yourself after your procedure. Your health care provider may also give you more specific instructions. Your treatment has been planned according to current medical practices, but problems sometimes occur. Call your health care provider if you have any problems or questions after your procedure.  Remove the  ureteral stents on Tuesday morning, May 06, 2018 by pulling the strings with a slow steady pull.  What can I expect after the procedure? After the procedure, it is common to have:  Nausea.  Mild pain when you urinate. You may feel this pain in your lower back or lower abdomen. Pain should stop within a few minutes after you urinate. This may last for up to 1 week.  A small amount of blood in your urine for several days.  Follow these instructions at home:  Medicines  Take over-the-counter and prescription medicines only as told by your health care provider.  If you were prescribed an antibiotic medicine, take it as told by your health care provider. Do not stop taking the antibiotic even if you start to feel better.  Do not drive for 24 hours if you received a sedative.  Do not drive or operate heavy machinery while taking prescription pain medicines. Activity  Return to your normal activities as told by your health care provider. Ask your health care provider what activities are safe for you.  Do not lift anything that is heavier than 10 lb (4.5 kg). Follow this limit for 1 week after your procedure, or for as long as told by your health care provider. General instructions  Watch for any blood in your urine. Call your health care provider if the amount of blood in your urine increases.  If you have a catheter: ? Follow instructions from your health care provider about taking care of your catheter and collection bag. ? Do not take baths, swim, or use a hot tub until your health care provider approves.  Drink enough  fluid to keep your urine clear or pale yellow.  Keep all follow-up visits as told by your health care provider. This is important. Contact a health care provider if:  You have pain that gets worse or does not get better with medicine, especially pain when you urinate.  You have difficulty urinating.  You feel nauseous or you vomit repeatedly during a period  of more than 2 days after the procedure. Get help right away if:  Your urine is dark red or has blood clots in it.  You are leaking urine (have incontinence).  The end of the stent comes out of your urethra.  You cannot urinate.  You have sudden, sharp, or severe pain in your abdomen or lower back.  You have a fever. This information is not intended to replace advice given to you by your health care provider. Make sure you discuss any questions you have with your health care provider. Document Released: 04/22/2013 Document Revised: 01/26/2016 Document Reviewed: 03/04/2015 Elsevier Interactive Patient Education  [employer].

## 2018-05-02 NOTE — Anesthesia Preprocedure Evaluation (Addendum)
Anesthesia Evaluation  Patient identified by MRN, date of birth, ID band Patient awake    Reviewed: Allergy & Precautions, NPO status , Patient's Chart, lab work & pertinent test results  Airway Mallampati: II  TM Distance: >3 FB Neck ROM: Full    Dental  (+) Teeth Intact, Dental Advisory Given   Pulmonary sleep apnea ,    breath sounds clear to auscultation       Cardiovascular hypertension, Pt. on medications  Rhythm:Regular Rate:Normal     Neuro/Psych Depression  Neuromuscular disease    GI/Hepatic Neg liver ROS, GERD  Medicated,  Endo/Other  diabetes, Type 2, Oral Hypoglycemic Agents  Renal/GU negative Renal ROS     Musculoskeletal   Abdominal Normal abdominal exam  (+)   Peds  Hematology   Anesthesia Other Findings   Reproductive/Obstetrics                            Lab Results  Component Value Date   HGB 11.9 (L) 05/02/2018   HCT 35.0 (L) 05/02/2018   Lab Results  Component Value Date   NA 142 05/02/2018   K 3.9 05/02/2018   EKG: normal sinus rhythm.   Anesthesia Physical Anesthesia Plan  ASA: II  Anesthesia Plan: General   Post-op Pain Management:    Induction: Intravenous  PONV Risk Score and Plan: 3 and Ondansetron, Dexamethasone and Midazolam  Airway Management Planned: LMA  Additional Equipment: None  Intra-op Plan:   Post-operative Plan: Extubation in OR  Informed Consent: I have reviewed the patients History and Physical, chart, labs and discussed the procedure including the risks, benefits and alternatives for the proposed anesthesia with the patient or authorized representative who has indicated his/her understanding and acceptance.   Dental advisory given  Plan Discussed with: CRNA  Anesthesia Plan Comments:        Anesthesia Quick Evaluation

## 2018-05-02 NOTE — Interval H&P Note (Signed)
History and Physical Interval Note:  05/02/2018 7:29 AM  Blake Young.  has presented today for surgery, with the diagnosis of BILATERAL RENAL STONES  The various methods of treatment have been discussed with the patient and family. After consideration of risks, benefits and other options for treatment, the patient has consented to  Procedure(s): CYSTOSCOPY/URETEROSCOPY/HOLMIUM LASER/STENT PLACEMENT (Bilateral) as a surgical intervention .  The patient's history has been reviewed, patient examined, no change in status, stable for surgery. Discussed replacement of one or both stents hopefully for just a few days with tethers. He's had no dysuria or fever. Occasional hematuria if he walks.  I have reviewed the patient's chart and labs.  Questions were answered to the patient's satisfaction.     Festus Aloe

## 2018-05-06 ENCOUNTER — Encounter (HOSPITAL_BASED_OUTPATIENT_CLINIC_OR_DEPARTMENT_OTHER): Payer: Self-pay | Admitting: Urology

## 2018-05-08 DIAGNOSIS — C44319 Basal cell carcinoma of skin of other parts of face: Secondary | ICD-10-CM | POA: Diagnosis not present

## 2018-05-17 DIAGNOSIS — F322 Major depressive disorder, single episode, severe without psychotic features: Secondary | ICD-10-CM | POA: Diagnosis not present

## 2018-06-09 ENCOUNTER — Encounter: Payer: Self-pay | Admitting: Physician Assistant

## 2018-06-09 ENCOUNTER — Ambulatory Visit (INDEPENDENT_AMBULATORY_CARE_PROVIDER_SITE_OTHER): Payer: PPO | Admitting: Physician Assistant

## 2018-06-09 DIAGNOSIS — F331 Major depressive disorder, recurrent, moderate: Secondary | ICD-10-CM | POA: Diagnosis not present

## 2018-06-09 DIAGNOSIS — G4700 Insomnia, unspecified: Secondary | ICD-10-CM | POA: Diagnosis not present

## 2018-06-09 NOTE — Progress Notes (Signed)
[facility] Check  Patient ID: Blake Brandis.,  MRN: [id number]  PCP: Wenda Low, MD  Date of Evaluation: 06/09/2018 Time spent:30 minutes   HISTORY/CURRENT STATUS: HPI Blake Young presents for routine six-month medication checkup.  States his condition pretty much remains the same.  He has a low level of depression at all times.  His girlfriend states that at times he will have periods where he is in a super good mood and start new projects but he never has increased energy with decreased need for sleep, no history of impulsivity or risky behavior, no history of Ms. daily or increase pending to the point of getting in financial trouble, no history of grandiosity.  He has been told in the past he has bipolar disorder by some providers and then others, that he does not have bipolar disorder" I am very confused as to what the diagnosis is but I do know that if I go off of the Wellbutrin I feel even worse than I do right now."  He does have some anxiety but it is usually mild and controllable.  He continues to have trouble sleeping.  At times he has trouble going to sleep and staying asleep.  The Sonata does help with that however he does not take it every night.  He has had times where he will eat in his sleep and not remember it, until the next morning he sees evidence in the kitchen that he during the night.  States is not as bad as when he was on Ambien but for this reason he does not want to take the medication every night. His parents have been ill off and on during the past 6 months.  They live in [place] and he has traveled back and forth multiple times to help them out. Even though his energy is low secondary to the compression, he is able to complete tasks that are absolutely necessary.  He denies any suicidal or homicidal thoughts. He asked about gene site testing that he has read about recently.  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 He has been seeing Rinaldo Cloud, LCSW and psychotherapy.  He states that he has had numerous therapist over many years has not found anyone  Individual Medical History/ Review of Systems: Changes? :No  Allergies: Patient has no known allergies.  Current Medications:  Current Outpatient Medications:  .  acetaminophen (TYLENOL) 500 MG tablet, Take 500 mg by mouth., Disp: , Rfl:  .  amLODipine (NORVASC) 2.5 MG tablet, Take 2.5 mg by mouth daily., Disp: , Rfl:  .  buPROPion (WELLBUTRIN XL) 150 MG 24 hr tablet, Take 150 mg by mouth daily. TAKES X 3 TABS, Disp: , Rfl:  .  fluticasone (FLONASE) 50 MCG/ACT nasal spray, Place into both nostrils as needed for allergies or rhinitis., Disp: , Rfl:  .  gabapentin (NEURONTIN) 300 MG capsule, Take 300 mg by mouth daily., Disp: , Rfl:  .  hydrocortisone 2.5 % cream, Apply topically as needed., Disp: , Rfl:  .  lisinopril (PRINIVIL,ZESTRIL) 20 MG tablet, Take 20 mg by mouth daily., Disp: , Rfl:  .  lovastatin (MEVACOR) 40 MG tablet, Take 40 mg by mouth at bedtime. , Disp: , Rfl:  .  metFORMIN (GLUCOPHAGE) 1000 MG tablet, Take 1,000 mg by mouth 2 (two) times daily with a meal., Disp: , Rfl:  .  Multiple Vitamins-Minerals (EMERGEN-C  IMMUNE PO), Take by mouth as needed., Disp: , Rfl:  .  ondansetron (ZOFRAN) 4 MG tablet, Take 1 tablet (4 mg total) by mouth every 8 (eight) hours as needed for nausea or vomiting., Disp: 12 tablet, Rfl: 1 .  oxyCODONE-acetaminophen (ROXICET) 5-325 MG tablet, Take 1-2 tablets by mouth every 6 (six) hours as needed for severe pain., Disp: 12 tablet, Rfl: 0 .  ranitidine (ZANTAC) 150 MG tablet, Take 150 mg by mouth 2 (two) times daily., Disp: , Rfl:  .  sildenafil (VIAGRA) 100 MG tablet, Take 100 mg by mouth as needed for erectile dysfunction., Disp: , Rfl:  .  zaleplon (SONATA) 10 MG capsule, Take 10 mg by mouth at bedtime as needed for sleep., Disp: , Rfl:  Medication Side Effects:  Sleep eating when taking Sonata  Family Medical/ Social History: Changes? No  MENTAL HEALTH EXAM:  There were no vitals taken for this visit.There is no height or weight on file to calculate BMI.  General Appearance: Well Groomed  Eye Contact:  Good  Speech:  Clear and Coherent  Volume:  Normal  Mood:  Euthymic  Affect:  Appropriate  Thought Process:  Coherent  Orientation:  Full (Time, Place, and Person)  Thought Content: Logical   Suicidal Thoughts:  No  Homicidal Thoughts:  No  Memory:  Immediate  Judgement:  Intact  Insight:  Good  Psychomotor Activity:  Normal  Concentration:  Concentration: Good  Recall:  Good  Fund of Knowledge: Good  Language: Good  Akathisia:  No  AIMS (if indicated): not done  Assets:  Communication Skills  ADL's:  Intact  Cognition: WNL  Prognosis:  Good    DIAGNOSES:    ICD-10-CM   1. Major depressive disorder, recurrent episode, moderate (HCC) F33.1   2. Insomnia, unspecified type G47.00     RECOMMENDATIONS: For now, continue Sonata and Wellbutrin as above. We discussed the pros and cons of GeneSight testing and agreed to hold off on that for now.  If any point in the future. Discussed the new treatments Spravato, the esketamine nasal spray for treatment resistant depression.  He would like to read about it and see if he is interested. We also discussed TMS and ECT.  Answered to the best of my ability. Continue psychotherapy as needed. I spent 50% of 30 minutes in counseling in reference to the diagnosis and treatment options. Return in 6 months or sooner as needed.  Donnal Moat, PA-C

## 2018-06-12 DIAGNOSIS — N200 Calculus of kidney: Secondary | ICD-10-CM | POA: Diagnosis not present

## 2018-06-23 ENCOUNTER — Encounter (HOSPITAL_COMMUNITY): Payer: Self-pay | Admitting: Urology

## 2018-07-28 DIAGNOSIS — F322 Major depressive disorder, single episode, severe without psychotic features: Secondary | ICD-10-CM | POA: Diagnosis not present

## 2018-08-19 DIAGNOSIS — F322 Major depressive disorder, single episode, severe without psychotic features: Secondary | ICD-10-CM | POA: Diagnosis not present

## 2018-08-19 DIAGNOSIS — N200 Calculus of kidney: Secondary | ICD-10-CM | POA: Diagnosis not present

## 2018-08-19 DIAGNOSIS — N400 Enlarged prostate without lower urinary tract symptoms: Secondary | ICD-10-CM | POA: Diagnosis not present

## 2018-08-19 DIAGNOSIS — K219 Gastro-esophageal reflux disease without esophagitis: Secondary | ICD-10-CM | POA: Diagnosis not present

## 2018-08-19 DIAGNOSIS — E7800 Pure hypercholesterolemia, unspecified: Secondary | ICD-10-CM | POA: Diagnosis not present

## 2018-08-19 DIAGNOSIS — N183 Chronic kidney disease, stage 3 (moderate): Secondary | ICD-10-CM | POA: Diagnosis not present

## 2018-08-19 DIAGNOSIS — Z7984 Long term (current) use of oral hypoglycemic drugs: Secondary | ICD-10-CM | POA: Diagnosis not present

## 2018-08-19 DIAGNOSIS — E1122 Type 2 diabetes mellitus with diabetic chronic kidney disease: Secondary | ICD-10-CM | POA: Diagnosis not present

## 2018-09-02 DIAGNOSIS — I10 Essential (primary) hypertension: Secondary | ICD-10-CM | POA: Diagnosis not present

## 2018-09-02 DIAGNOSIS — F322 Major depressive disorder, single episode, severe without psychotic features: Secondary | ICD-10-CM | POA: Diagnosis not present

## 2018-09-02 DIAGNOSIS — N400 Enlarged prostate without lower urinary tract symptoms: Secondary | ICD-10-CM | POA: Diagnosis not present

## 2018-09-02 DIAGNOSIS — Z7984 Long term (current) use of oral hypoglycemic drugs: Secondary | ICD-10-CM | POA: Diagnosis not present

## 2018-09-02 DIAGNOSIS — N183 Chronic kidney disease, stage 3 (moderate): Secondary | ICD-10-CM | POA: Diagnosis not present

## 2018-09-02 DIAGNOSIS — E1122 Type 2 diabetes mellitus with diabetic chronic kidney disease: Secondary | ICD-10-CM | POA: Diagnosis not present

## 2018-09-19 ENCOUNTER — Telehealth: Payer: Self-pay

## 2018-09-19 DIAGNOSIS — H40013 Open angle with borderline findings, low risk, bilateral: Secondary | ICD-10-CM | POA: Diagnosis not present

## 2018-09-19 MED ORDER — BUPROPION HCL ER (XL) 150 MG PO TB24: 450.0000 mg | ORAL_TABLET | Freq: Every day | ORAL | 1 refills | Status: DC

## 2018-09-19 NOTE — Telephone Encounter (Signed)
Received refill request from [facility] for Wellbutrin XL 150 mg tablets for 90 day supply  Last OV 06/09/2018 Next OV 6 months

## 2018-09-23 DIAGNOSIS — Z7984 Long term (current) use of oral hypoglycemic drugs: Secondary | ICD-10-CM | POA: Diagnosis not present

## 2018-09-23 DIAGNOSIS — F322 Major depressive disorder, single episode, severe without psychotic features: Secondary | ICD-10-CM | POA: Diagnosis not present

## 2018-09-23 DIAGNOSIS — N183 Chronic kidney disease, stage 3 (moderate): Secondary | ICD-10-CM | POA: Diagnosis not present

## 2018-09-23 DIAGNOSIS — N400 Enlarged prostate without lower urinary tract symptoms: Secondary | ICD-10-CM | POA: Diagnosis not present

## 2018-09-23 DIAGNOSIS — I10 Essential (primary) hypertension: Secondary | ICD-10-CM | POA: Diagnosis not present

## 2018-09-23 DIAGNOSIS — E1122 Type 2 diabetes mellitus with diabetic chronic kidney disease: Secondary | ICD-10-CM | POA: Diagnosis not present

## 2018-10-03 DIAGNOSIS — N138 Other obstructive and reflux uropathy: Secondary | ICD-10-CM | POA: Diagnosis not present

## 2018-10-22 DIAGNOSIS — F322 Major depressive disorder, single episode, severe without psychotic features: Secondary | ICD-10-CM | POA: Diagnosis not present

## 2018-10-22 DIAGNOSIS — I10 Essential (primary) hypertension: Secondary | ICD-10-CM | POA: Diagnosis not present

## 2018-10-22 DIAGNOSIS — E1122 Type 2 diabetes mellitus with diabetic chronic kidney disease: Secondary | ICD-10-CM | POA: Diagnosis not present

## 2018-10-22 DIAGNOSIS — N400 Enlarged prostate without lower urinary tract symptoms: Secondary | ICD-10-CM | POA: Diagnosis not present

## 2018-10-22 DIAGNOSIS — N183 Chronic kidney disease, stage 3 (moderate): Secondary | ICD-10-CM | POA: Diagnosis not present

## 2018-10-30 DIAGNOSIS — N183 Chronic kidney disease, stage 3 (moderate): Secondary | ICD-10-CM | POA: Diagnosis not present

## 2018-11-04 ENCOUNTER — Other Ambulatory Visit: Payer: Self-pay | Admitting: Internal Medicine

## 2018-11-04 DIAGNOSIS — N183 Chronic kidney disease, stage 3 unspecified: Secondary | ICD-10-CM

## 2018-11-04 DIAGNOSIS — Z7984 Long term (current) use of oral hypoglycemic drugs: Secondary | ICD-10-CM | POA: Diagnosis not present

## 2018-11-04 DIAGNOSIS — E1122 Type 2 diabetes mellitus with diabetic chronic kidney disease: Secondary | ICD-10-CM | POA: Diagnosis not present

## 2018-11-06 ENCOUNTER — Ambulatory Visit
Admission: RE | Admit: 2018-11-06 | Discharge: 2018-11-06 | Disposition: A | Discharge status: Home / Self Care | Payer: PPO | Source: Ambulatory Visit | Attending: Internal Medicine | Admitting: Internal Medicine

## 2018-11-06 DIAGNOSIS — N189 Chronic kidney disease, unspecified: Secondary | ICD-10-CM | POA: Diagnosis not present

## 2018-11-06 DIAGNOSIS — N183 Chronic kidney disease, stage 3 unspecified: Secondary | ICD-10-CM

## 2018-11-12 DIAGNOSIS — E1122 Type 2 diabetes mellitus with diabetic chronic kidney disease: Secondary | ICD-10-CM | POA: Diagnosis not present

## 2018-12-01 DIAGNOSIS — I10 Essential (primary) hypertension: Secondary | ICD-10-CM | POA: Diagnosis not present

## 2018-12-01 DIAGNOSIS — N400 Enlarged prostate without lower urinary tract symptoms: Secondary | ICD-10-CM | POA: Diagnosis not present

## 2018-12-01 DIAGNOSIS — Z7984 Long term (current) use of oral hypoglycemic drugs: Secondary | ICD-10-CM | POA: Diagnosis not present

## 2018-12-01 DIAGNOSIS — N183 Chronic kidney disease, stage 3 (moderate): Secondary | ICD-10-CM | POA: Diagnosis not present

## 2018-12-01 DIAGNOSIS — E1122 Type 2 diabetes mellitus with diabetic chronic kidney disease: Secondary | ICD-10-CM | POA: Diagnosis not present

## 2018-12-01 DIAGNOSIS — F322 Major depressive disorder, single episode, severe without psychotic features: Secondary | ICD-10-CM | POA: Diagnosis not present

## 2018-12-05 DIAGNOSIS — E1122 Type 2 diabetes mellitus with diabetic chronic kidney disease: Secondary | ICD-10-CM | POA: Diagnosis not present

## 2018-12-05 DIAGNOSIS — I129 Hypertensive chronic kidney disease with stage 1 through stage 4 chronic kidney disease, or unspecified chronic kidney disease: Secondary | ICD-10-CM | POA: Diagnosis not present

## 2018-12-05 DIAGNOSIS — J449 Chronic obstructive pulmonary disease, unspecified: Secondary | ICD-10-CM | POA: Diagnosis not present

## 2018-12-05 DIAGNOSIS — N183 Chronic kidney disease, stage 3 (moderate): Secondary | ICD-10-CM | POA: Diagnosis not present

## 2018-12-20 IMAGING — US US RENAL
1 series · 14 of 25 positions shown · non-contrast
Comparison: None.

CLINICAL DATA: 65-year-old male with chronic kidney disease stage
3. Initial encounter.

EXAM:
RENAL / URINARY TRACT ULTRASOUND COMPLETE

[Series 1: us renal · 0.23mm/px · 14 of 42 slices shown]
[im 1/42]
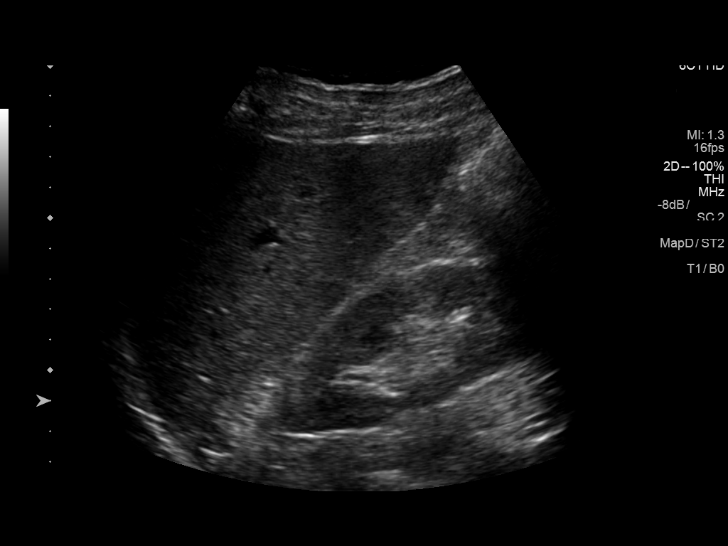
[im 4/42]
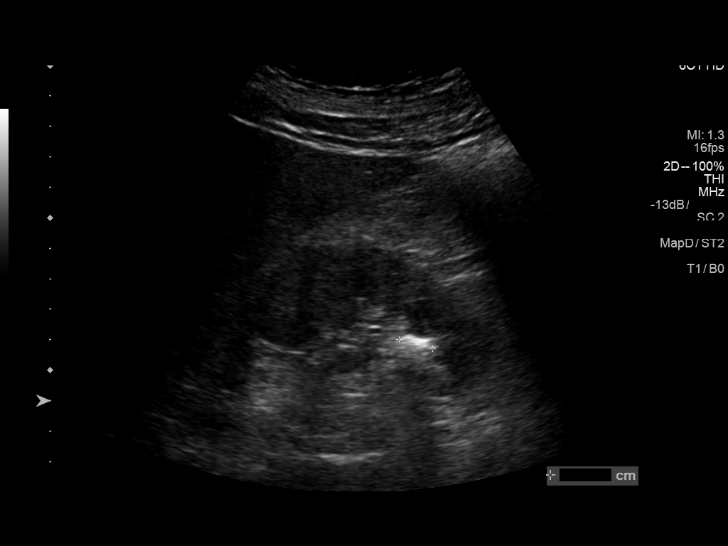
[im 7/42]
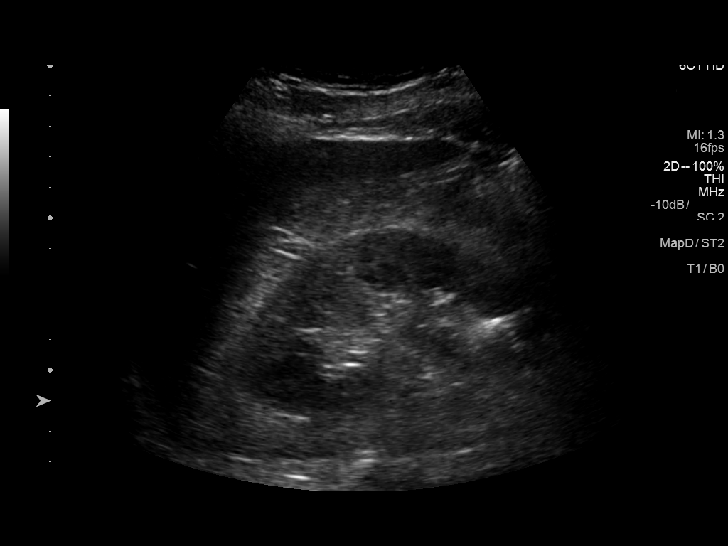
[im 11/42]
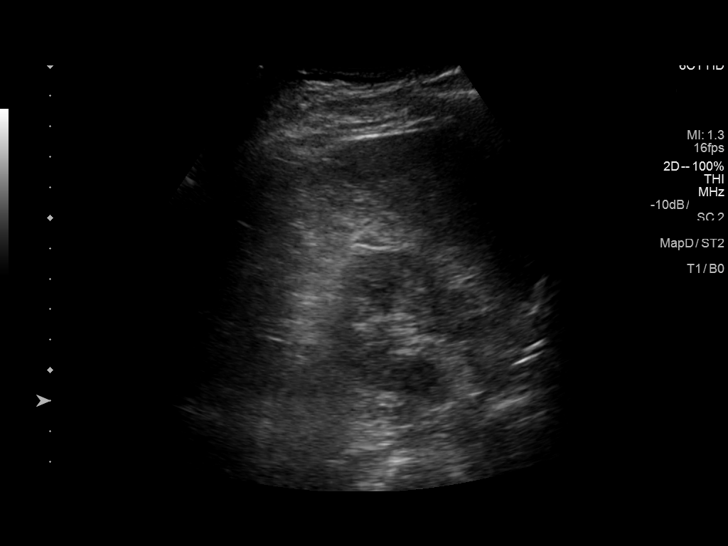
[im 14/42]
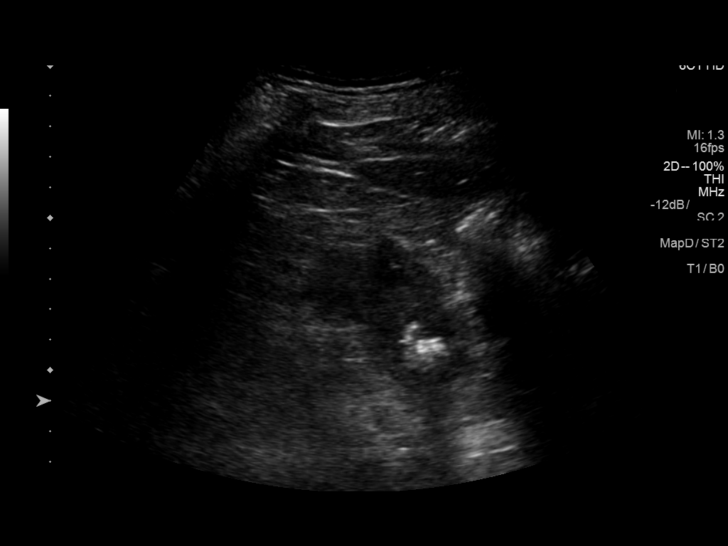
[im 16/42]
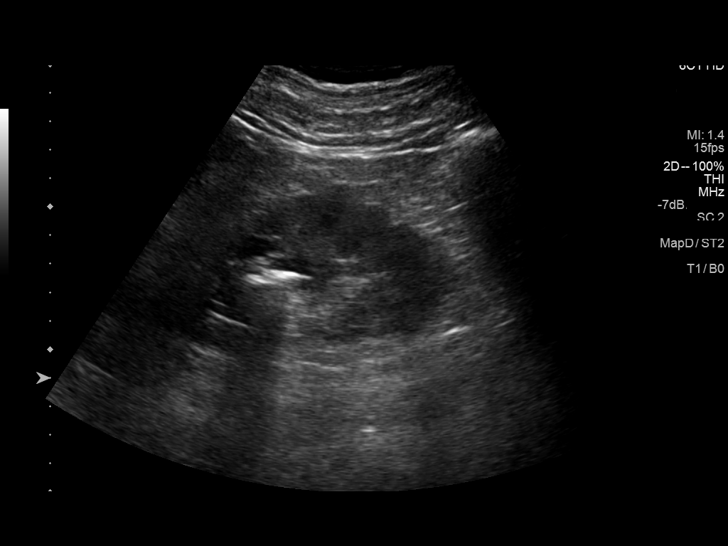
[im 19/42]
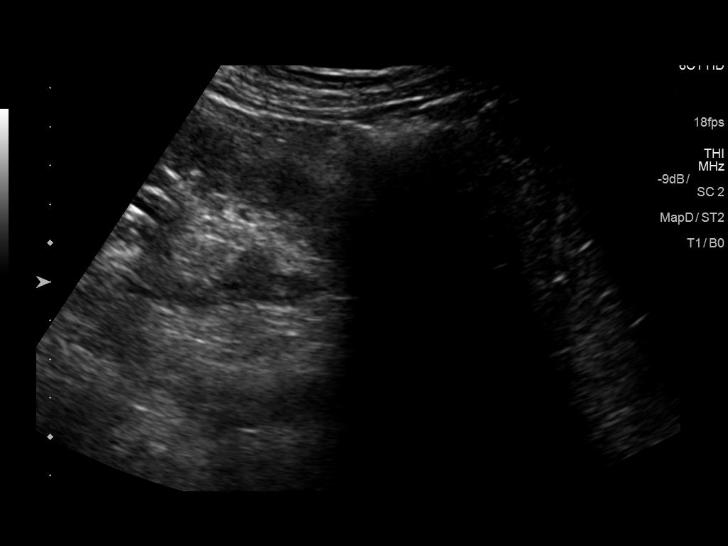
[im 23/42]
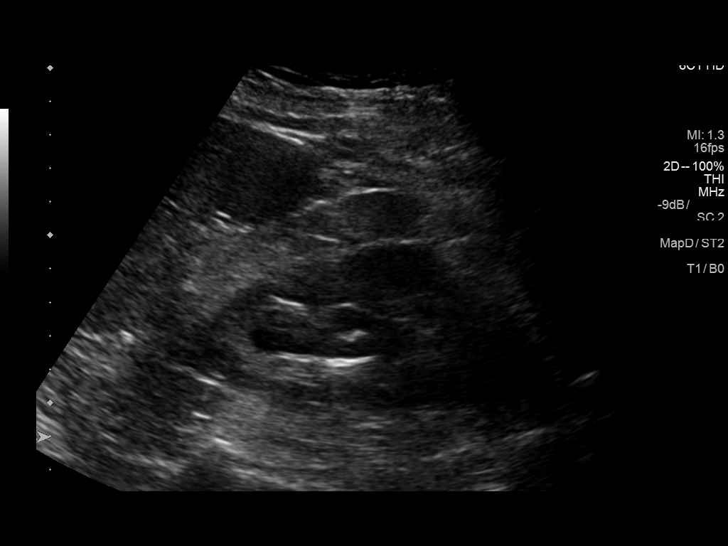
[im 26/42]
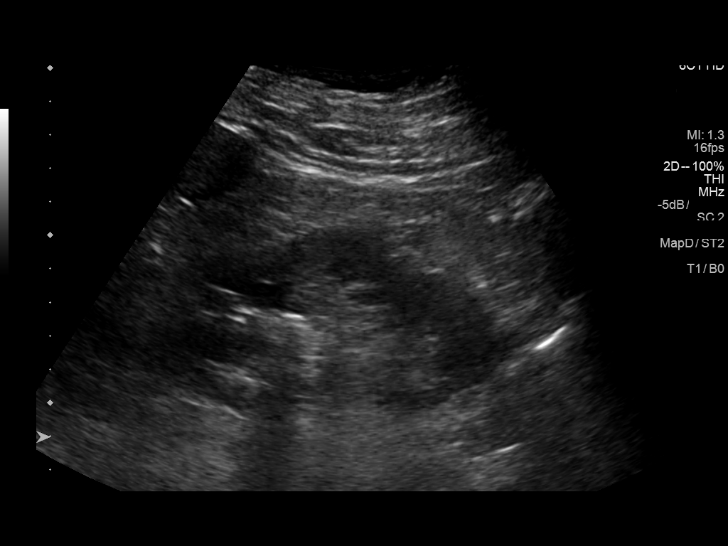
[im 28/42]
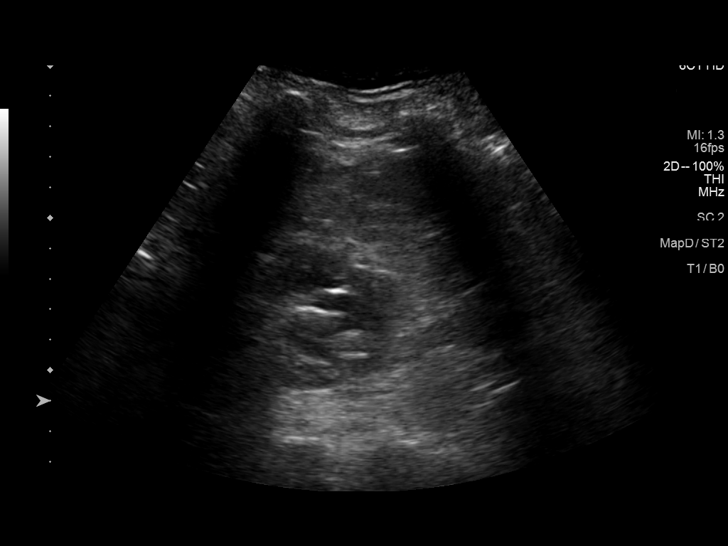
[im 31/42]
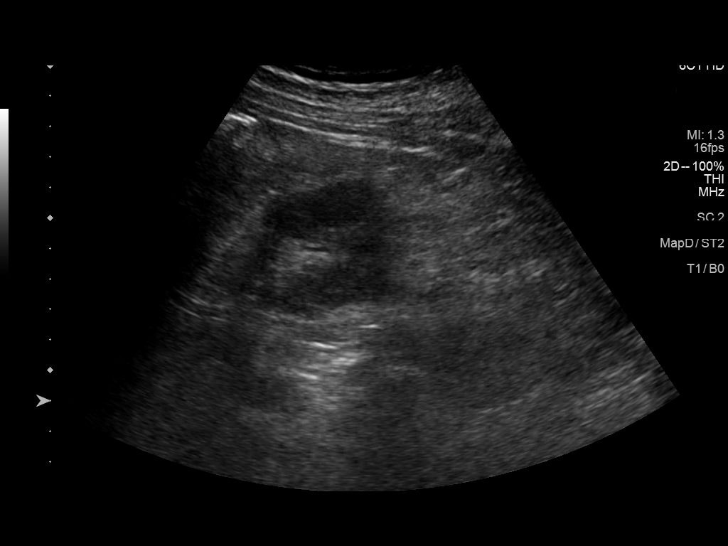
[im 35/42]
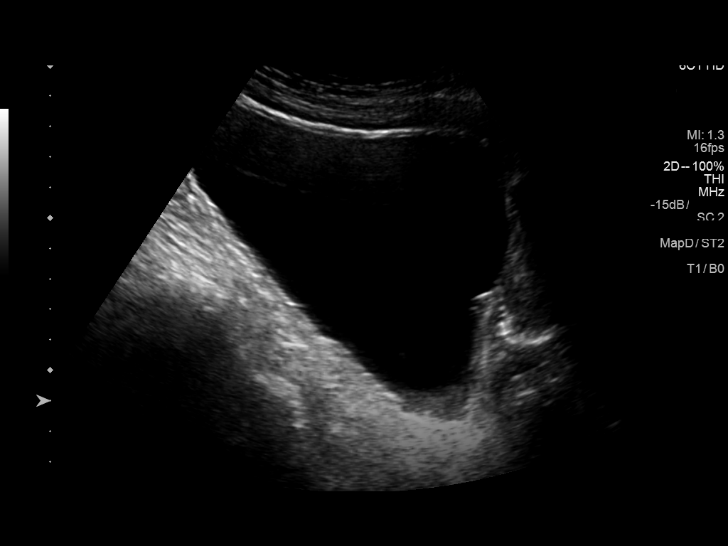
[im 38/42]
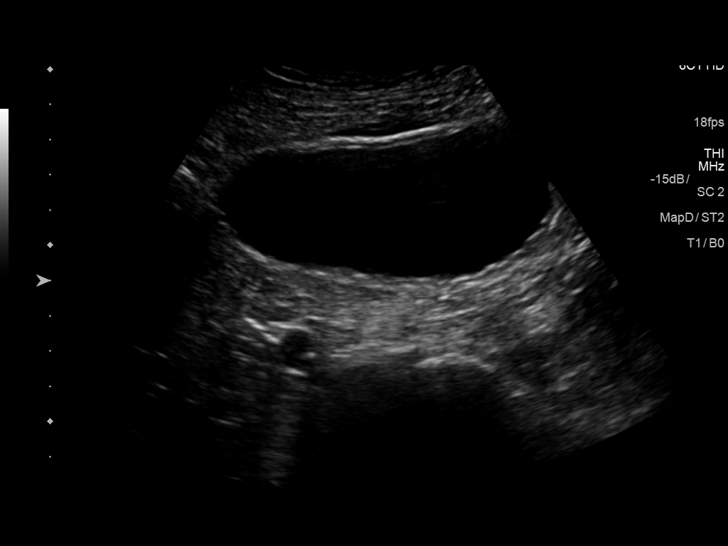
[im 42/42]
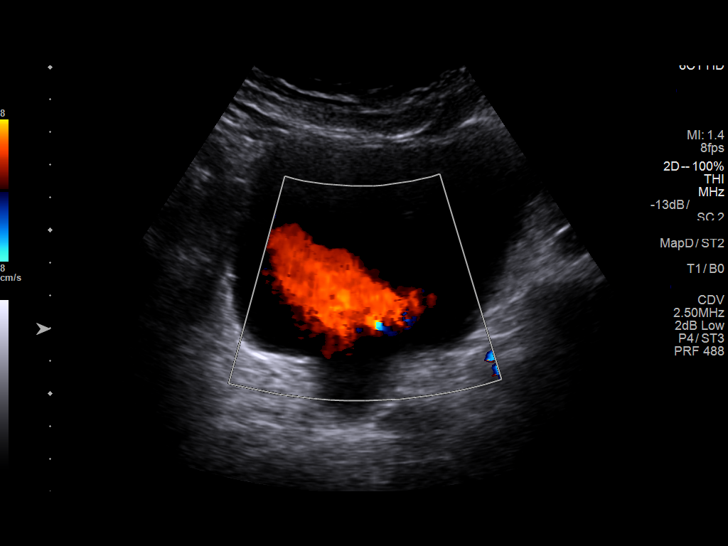

[14 of 25 positions shown; findings below may reference images not displayed]

FINDINGS: Right Kidney:

Length: 11.2 cm. Echogenicity within normal limits. No
hydronephrosis visualized. Lower pole nonobstructing 11 mm calculus.

Left Kidney:

Length: 10.9 cm. Echogenicity within normal limits. Mild fullness
upper pole collecting system with 20 mm obstructing stone.

Bladder:

Appears normal for degree of bladder distention. Bilateral ureteral
jets noted
IMPRESSION: Left upper pole mild renal collecting system fullness which appears
to be secondary to 20 mm obstructing stone.

Nonobstructing 11 mm right lower pole calculus.

No renal parenchymal thinning or increased echogenicity.

## 2018-12-22 ENCOUNTER — Encounter: Payer: Self-pay | Admitting: Physician Assistant

## 2018-12-22 ENCOUNTER — Ambulatory Visit (INDEPENDENT_AMBULATORY_CARE_PROVIDER_SITE_OTHER): Payer: HMO | Admitting: Physician Assistant

## 2018-12-22 ENCOUNTER — Other Ambulatory Visit: Payer: Self-pay

## 2018-12-22 DIAGNOSIS — F331 Major depressive disorder, recurrent, moderate: Secondary | ICD-10-CM | POA: Diagnosis not present

## 2018-12-22 DIAGNOSIS — G4700 Insomnia, unspecified: Secondary | ICD-10-CM

## 2018-12-22 NOTE — Progress Notes (Signed)
[facility]  Patient ID: Blake Young.,  MRN: [id number]  PCP: Wenda Low, MD  Date of Evaluation: 12/22/2018 Time spent:15 minutes  Chief Complaint:  Chief Complaint    Follow-up     Virtual Visit via Telephone Note  I connected with Blake Young. on 12/22/18 at  1:00 PM EDT by telephone and verified that I am speaking with the correct person using two identifiers.   I discussed the limitations, risks, security and privacy concerns of performing an evaluation and management service by telephone and the availability of in person appointments. I also discussed with the patient that there may be a patient responsible charge related to this service. The patient expressed understanding and agreed to proceed.  HISTORY/CURRENT STATUS: HPI For 6 month med check.  Mood is pretty stable. Because of the quarantine he's gotten a little bored.  The only thing he does is go to [place] to visit his parents and then home.    Patient denies loss of interest in usual activities and is able to enjoy things.  He does have decrease motivation in the mornings and has trouble getting up out of bed in the mornings.  He pushes himself to get up and then he feels better.   Appetite has not changed.  No extreme sadness, tearfulness, or feelings of hopelessness.  Denies any changes in concentration, making decisions or remembering things.  Denies suicidal or homicidal thoughts.  Has some anxiety especially now b/c of the coronavirus.  He can't see his Mom who is in a NH w/ Alzheimers, that's stressful.  His sleep has gotten better and he was not needing the zaleplon hardly ever.  But over the past few weeks, it has gotten worse again where he had trouble falling asleep.  The zaleplon is effective but if he ends up needing to, he does have nocturnal amnesia.  He only takes 1 due to that.  Denies muscle or joint pain, stiffness, or dystonia.  Denies dizziness, syncope, seizures, numbness,  tingling, tremor, tics, unsteady gait, slurred speech, confusion.   Individual Medical History/ Review of Systems: Changes? :Yes kidney  Functions have worsened so the metformin, and lisinopril were d/c.He found this out in Dec.   Allergies: Patient has no known allergies.  Current Medications:  Current Outpatient Medications:  .  acetaminophen (TYLENOL) 500 MG tablet, Take 500 mg by mouth., Disp: , Rfl:  .  amLODipine (NORVASC) 2.5 MG tablet, Take 2.5 mg by mouth daily., Disp: , Rfl:  .  buPROPion (WELLBUTRIN XL) 150 MG 24 hr tablet, Take 3 tablets (450 mg total) by mouth daily., Disp: 270 tablet, Rfl: 1 .  gabapentin (NEURONTIN) 300 MG capsule, Take 300 mg by mouth daily., Disp: , Rfl:  .  hydrocortisone 2.5 % cream, Apply topically as needed., Disp: , Rfl:  .  lovastatin (MEVACOR) 40 MG tablet, Take 40 mg by mouth at bedtime. , Disp: , Rfl:  .  Multiple Vitamins-Minerals (EMERGEN-C IMMUNE PO), Take by mouth as needed., Disp: , Rfl:  .  ondansetron (ZOFRAN) 4 MG tablet, Take 1 tablet (4 mg total) by mouth every 8 (eight) hours as needed for nausea or vomiting., Disp: 12 tablet, Rfl: 1 .  sildenafil (VIAGRA) 100 MG tablet, Take 100 mg by mouth as needed for erectile dysfunction., Disp: , Rfl:  .  zaleplon (SONATA) 10 MG capsule, Take 10 mg by mouth at bedtime as needed for sleep., Disp: , Rfl:  .  famotidine (PEPCID) 20 MG tablet, ,  Disp: , Rfl:  .  fluticasone (FLONASE) 50 MCG/ACT nasal spray, Place into both nostrils as needed for allergies or rhinitis., Disp: , Rfl:  .  glipiZIDE (GLUCOTROL XL) 2.5 MG 24 hr tablet, TAKE 1 TABLET BY MOUTH ONCE DAILY IN THE MORNING WITH BREAKFAST, Disp: , Rfl:  .  lisinopril (PRINIVIL,ZESTRIL) 20 MG tablet, Take 20 mg by mouth daily., Disp: , Rfl:  .  metFORMIN (GLUCOPHAGE) 1000 MG tablet, Take 1,000 mg by mouth 2 (two) times daily with a meal., Disp: , Rfl:  .  oxyCODONE-acetaminophen (ROXICET) 5-325 MG tablet, Take 1-2 tablets by mouth every 6 (six) hours  as needed for severe pain. (Patient not taking: Reported on 12/22/2018), Disp: 12 tablet, Rfl: 0 .  ranitidine (ZANTAC) 150 MG tablet, Take 150 mg by mouth 2 (two) times daily., Disp: , Rfl:  .  TRULICITY 9.02 IO/9.7DZ SOPN, , Disp: , Rfl:  Medication Side Effects: none  Family Medical/ Social History: Changes? Yes d/t social distancing from coronavirus pandemic  MENTAL HEALTH EXAM:  There were no vitals taken for this visit.There is no height or weight on file to calculate BMI.  General Appearance: phone visit unable to assess  Eye Contact:  unable to assess  Speech:  Clear and Coherent  Volume:  Normal  Mood:  Euthymic  Affect:  unable to assess  Thought Process:  Goal Directed  Orientation:  Full (Time, Place, and Person)  Thought Content: Logical   Suicidal Thoughts:  No  Homicidal Thoughts:  No  Memory:  WNL  Judgement:  Good  Insight:  Good  Psychomotor Activity:  unable to assess  Concentration:  Concentration: Good  Recall:  Good  Fund of Knowledge: Good  Language: Good  Assets:  Desire for Improvement  ADL's:  Intact  Cognition: WNL  Prognosis:  Good    DIAGNOSES:    ICD-10-CM   1. Major depressive disorder, recurrent episode, moderate (HCC) F33.1   2. Insomnia, unspecified type G47.00     Receiving Psychotherapy: No    RECOMMENDATIONS:  Continue Wellbutrin XL 450 mg p.o. daily. Continue Sonata 10 mg nightly as needed and may repeat 1 for mid nocturnal awakening as needed as long as he has 3 hours left to sleep. Return in 6 months.   Donnal Moat, PA-C   This record has been created using [employer].  Chart creation errors have been sought, but may not always have been located and corrected. Such creation errors do not reflect on the standard of medical care.

## 2018-12-24 DIAGNOSIS — N183 Chronic kidney disease, stage 3 (moderate): Secondary | ICD-10-CM | POA: Diagnosis not present

## 2018-12-24 DIAGNOSIS — I10 Essential (primary) hypertension: Secondary | ICD-10-CM | POA: Diagnosis not present

## 2018-12-24 DIAGNOSIS — F322 Major depressive disorder, single episode, severe without psychotic features: Secondary | ICD-10-CM | POA: Diagnosis not present

## 2018-12-24 DIAGNOSIS — N400 Enlarged prostate without lower urinary tract symptoms: Secondary | ICD-10-CM | POA: Diagnosis not present

## 2018-12-24 DIAGNOSIS — Z7984 Long term (current) use of oral hypoglycemic drugs: Secondary | ICD-10-CM | POA: Diagnosis not present

## 2018-12-24 DIAGNOSIS — E1122 Type 2 diabetes mellitus with diabetic chronic kidney disease: Secondary | ICD-10-CM | POA: Diagnosis not present

## 2018-12-29 DIAGNOSIS — E1122 Type 2 diabetes mellitus with diabetic chronic kidney disease: Secondary | ICD-10-CM | POA: Diagnosis not present

## 2019-01-01 DIAGNOSIS — F322 Major depressive disorder, single episode, severe without psychotic features: Secondary | ICD-10-CM | POA: Diagnosis not present

## 2019-01-01 DIAGNOSIS — N400 Enlarged prostate without lower urinary tract symptoms: Secondary | ICD-10-CM | POA: Diagnosis not present

## 2019-01-01 DIAGNOSIS — I10 Essential (primary) hypertension: Secondary | ICD-10-CM | POA: Diagnosis not present

## 2019-01-01 DIAGNOSIS — E7800 Pure hypercholesterolemia, unspecified: Secondary | ICD-10-CM | POA: Diagnosis not present

## 2019-01-01 DIAGNOSIS — E1122 Type 2 diabetes mellitus with diabetic chronic kidney disease: Secondary | ICD-10-CM | POA: Diagnosis not present

## 2019-01-01 DIAGNOSIS — Z7984 Long term (current) use of oral hypoglycemic drugs: Secondary | ICD-10-CM | POA: Diagnosis not present

## 2019-01-01 DIAGNOSIS — N183 Chronic kidney disease, stage 3 (moderate): Secondary | ICD-10-CM | POA: Diagnosis not present

## 2019-01-01 DIAGNOSIS — G4733 Obstructive sleep apnea (adult) (pediatric): Secondary | ICD-10-CM | POA: Diagnosis not present

## 2019-01-13 DIAGNOSIS — E1122 Type 2 diabetes mellitus with diabetic chronic kidney disease: Secondary | ICD-10-CM | POA: Diagnosis not present

## 2019-02-05 DIAGNOSIS — E1122 Type 2 diabetes mellitus with diabetic chronic kidney disease: Secondary | ICD-10-CM | POA: Diagnosis not present

## 2019-02-06 ENCOUNTER — Other Ambulatory Visit: Payer: Self-pay

## 2019-02-06 NOTE — Patient Outreach (Signed)
  [place] [facility]) Care Management Chronic Special Needs Program    02/06/2019  Name: Blake Young., DOB: October 07, 1950  MRN: [id number]   [name] is enrolled in a chronic special needs plan. RNCM called to review health risk assessment and complete individualized care plan. No answer. HIPAA complaint message left.  Plan: RNCM will follow up next week.  Thea Silversmith, RN, MSN, [place] [phone]

## 2019-02-09 DIAGNOSIS — N200 Calculus of kidney: Secondary | ICD-10-CM | POA: Diagnosis not present

## 2019-02-09 DIAGNOSIS — R3915 Urgency of urination: Secondary | ICD-10-CM | POA: Diagnosis not present

## 2019-02-09 DIAGNOSIS — N401 Enlarged prostate with lower urinary tract symptoms: Secondary | ICD-10-CM | POA: Diagnosis not present

## 2019-02-11 ENCOUNTER — Other Ambulatory Visit: Payer: Self-pay

## 2019-02-11 NOTE — Patient Outreach (Signed)
  [place] [facility]) Care Management Chronic Special Needs Program    02/11/2019  Name: Nicco Reaume., DOB: 1951/06/12  MRN: [id number]   Mr. Andros Channing is enrolled in a chronic special needs plan. RNCM called to review health risk assessment and complete individualized care plan. Client is unable to discuss his health with RNCM today.  Plan: Telephonic outreach rescheduled.  Thea Silversmith, RN, MSN, [place] [phone]

## 2019-02-12 ENCOUNTER — Other Ambulatory Visit: Payer: Self-pay

## 2019-02-12 NOTE — Patient Outreach (Addendum)
  [place] [facility]) Care Management Chronic Special Needs Program  02/12/2019  Name: Emmet Messer. DOB: [birth year]  MRN: [id number]  Mr. Adriene Padula is enrolled in a chronic special needs plan for diabetes. Chronic Care Management Coordinator telephoned client to review health risk assessment and to develop individualized care plan.  Introduced the chronic care management program, importance of client participation, and taking their care plan to all provider appointments and inpatient facilities.    Subjective: client reports a history of DM,HTN. Client also reports a history of CKD. He states he is working with a nutritionist which is helping him to select better choices. A1C 7.5 per client. He denies any questions or concerns. Denies any difficulty managing or affording medication. Client checks his blood sugar at least twice a day. Client reports history of depression but report it is under control at this time.  Goals Addressed            This Visit's Progress   . Client understands the importance of follow-up with providers by attending scheduled visits      . COMPLETED: Client will use Assistive Devices as needed and verbalize understanding of device use       Denies any issues or questions regarding use of glucometer.    . Client will verbalize knowledge of self management of Hypertension as evidences by BP reading of 140/90 or less; or as defined by provider      . HEMOGLOBIN A1C < 7.0       Diabetes self management actions: Glucose monitoring per provider recommendations Eat Healthy Check feet daily Visit provider every 3-6 months as directed Hbg A1C level every 3-6 months. Eye Exam yearly    . Maintain timely refills of diabetic medication as prescribed within the year .      Marland Kitchen Obtain annual  Lipid Profile, LDL-C      . Obtain Annual Eye (retinal)  Exam       . Obtain Annual Foot Exam      . Obtain annual screen for micro albuminuria (urine) , nephropathy  (kidney problems)      . Obtain Hemoglobin A1C at least 2 times per year      . Visit Primary Care Provider or Endocrinologist at least 2 times per year         Covid 19 precautions discussed. RNCM reinforced the 24 hour nurse advice line. RNCM encouraged client to contact health care concierge for benefit questions. Client encouraged to call RNCM as needed/Confirmed he has contact number.  Plan:  Send successful outreach letter with a copy of their individualized care plan, Send individual care plan to provider and Send educational material Chronic care management coordination will outreach in: 6 months.   Thea Silversmith, RN, MSN, [place] [phone]

## 2019-02-25 DIAGNOSIS — F322 Major depressive disorder, single episode, severe without psychotic features: Secondary | ICD-10-CM | POA: Diagnosis not present

## 2019-02-25 DIAGNOSIS — I10 Essential (primary) hypertension: Secondary | ICD-10-CM | POA: Diagnosis not present

## 2019-02-25 DIAGNOSIS — N183 Chronic kidney disease, stage 3 (moderate): Secondary | ICD-10-CM | POA: Diagnosis not present

## 2019-02-25 DIAGNOSIS — N400 Enlarged prostate without lower urinary tract symptoms: Secondary | ICD-10-CM | POA: Diagnosis not present

## 2019-02-25 DIAGNOSIS — E1122 Type 2 diabetes mellitus with diabetic chronic kidney disease: Secondary | ICD-10-CM | POA: Diagnosis not present

## 2019-02-25 DIAGNOSIS — Z7984 Long term (current) use of oral hypoglycemic drugs: Secondary | ICD-10-CM | POA: Diagnosis not present

## 2019-03-11 DIAGNOSIS — N183 Chronic kidney disease, stage 3 (moderate): Secondary | ICD-10-CM | POA: Diagnosis not present

## 2019-03-11 DIAGNOSIS — N200 Calculus of kidney: Secondary | ICD-10-CM | POA: Diagnosis not present

## 2019-03-11 DIAGNOSIS — E7800 Pure hypercholesterolemia, unspecified: Secondary | ICD-10-CM | POA: Diagnosis not present

## 2019-03-11 DIAGNOSIS — E1122 Type 2 diabetes mellitus with diabetic chronic kidney disease: Secondary | ICD-10-CM | POA: Diagnosis not present

## 2019-03-11 DIAGNOSIS — Z7984 Long term (current) use of oral hypoglycemic drugs: Secondary | ICD-10-CM | POA: Diagnosis not present

## 2019-03-11 DIAGNOSIS — Z0000 Encounter for general adult medical examination without abnormal findings: Secondary | ICD-10-CM | POA: Diagnosis not present

## 2019-03-11 DIAGNOSIS — I10 Essential (primary) hypertension: Secondary | ICD-10-CM | POA: Diagnosis not present

## 2019-03-11 DIAGNOSIS — F322 Major depressive disorder, single episode, severe without psychotic features: Secondary | ICD-10-CM | POA: Diagnosis not present

## 2019-03-11 DIAGNOSIS — N400 Enlarged prostate without lower urinary tract symptoms: Secondary | ICD-10-CM | POA: Diagnosis not present

## 2019-03-11 DIAGNOSIS — G4700 Insomnia, unspecified: Secondary | ICD-10-CM | POA: Diagnosis not present

## 2019-03-20 DIAGNOSIS — E1122 Type 2 diabetes mellitus with diabetic chronic kidney disease: Secondary | ICD-10-CM | POA: Diagnosis not present

## 2019-03-20 DIAGNOSIS — Z7984 Long term (current) use of oral hypoglycemic drugs: Secondary | ICD-10-CM | POA: Diagnosis not present

## 2019-03-20 DIAGNOSIS — I10 Essential (primary) hypertension: Secondary | ICD-10-CM | POA: Diagnosis not present

## 2019-03-20 DIAGNOSIS — F322 Major depressive disorder, single episode, severe without psychotic features: Secondary | ICD-10-CM | POA: Diagnosis not present

## 2019-03-20 DIAGNOSIS — N400 Enlarged prostate without lower urinary tract symptoms: Secondary | ICD-10-CM | POA: Diagnosis not present

## 2019-03-20 DIAGNOSIS — N183 Chronic kidney disease, stage 3 (moderate): Secondary | ICD-10-CM | POA: Diagnosis not present

## 2019-03-23 DIAGNOSIS — E1122 Type 2 diabetes mellitus with diabetic chronic kidney disease: Secondary | ICD-10-CM | POA: Diagnosis not present

## 2019-04-24 DIAGNOSIS — F322 Major depressive disorder, single episode, severe without psychotic features: Secondary | ICD-10-CM | POA: Diagnosis not present

## 2019-04-24 DIAGNOSIS — N400 Enlarged prostate without lower urinary tract symptoms: Secondary | ICD-10-CM | POA: Diagnosis not present

## 2019-04-24 DIAGNOSIS — I10 Essential (primary) hypertension: Secondary | ICD-10-CM | POA: Diagnosis not present

## 2019-04-24 DIAGNOSIS — N183 Chronic kidney disease, stage 3 (moderate): Secondary | ICD-10-CM | POA: Diagnosis not present

## 2019-04-24 DIAGNOSIS — E1122 Type 2 diabetes mellitus with diabetic chronic kidney disease: Secondary | ICD-10-CM | POA: Diagnosis not present

## 2019-05-06 DIAGNOSIS — E119 Type 2 diabetes mellitus without complications: Secondary | ICD-10-CM | POA: Diagnosis not present

## 2019-05-07 DIAGNOSIS — D225 Melanocytic nevi of trunk: Secondary | ICD-10-CM | POA: Diagnosis not present

## 2019-05-07 DIAGNOSIS — B078 Other viral warts: Secondary | ICD-10-CM | POA: Diagnosis not present

## 2019-05-07 DIAGNOSIS — D1801 Hemangioma of skin and subcutaneous tissue: Secondary | ICD-10-CM | POA: Diagnosis not present

## 2019-05-07 DIAGNOSIS — L821 Other seborrheic keratosis: Secondary | ICD-10-CM | POA: Diagnosis not present

## 2019-05-07 DIAGNOSIS — Z85828 Personal history of other malignant neoplasm of skin: Secondary | ICD-10-CM | POA: Diagnosis not present

## 2019-05-07 DIAGNOSIS — L218 Other seborrheic dermatitis: Secondary | ICD-10-CM | POA: Diagnosis not present

## 2019-05-08 DIAGNOSIS — Z23 Encounter for immunization: Secondary | ICD-10-CM | POA: Diagnosis not present

## 2019-05-08 DIAGNOSIS — K645 Perianal venous thrombosis: Secondary | ICD-10-CM | POA: Diagnosis not present

## 2019-05-25 DIAGNOSIS — N400 Enlarged prostate without lower urinary tract symptoms: Secondary | ICD-10-CM | POA: Diagnosis not present

## 2019-05-25 DIAGNOSIS — E1122 Type 2 diabetes mellitus with diabetic chronic kidney disease: Secondary | ICD-10-CM | POA: Diagnosis not present

## 2019-05-25 DIAGNOSIS — N183 Chronic kidney disease, stage 3 (moderate): Secondary | ICD-10-CM | POA: Diagnosis not present

## 2019-05-25 DIAGNOSIS — F322 Major depressive disorder, single episode, severe without psychotic features: Secondary | ICD-10-CM | POA: Diagnosis not present

## 2019-05-25 DIAGNOSIS — I10 Essential (primary) hypertension: Secondary | ICD-10-CM | POA: Diagnosis not present

## 2019-05-26 DIAGNOSIS — E1122 Type 2 diabetes mellitus with diabetic chronic kidney disease: Secondary | ICD-10-CM | POA: Diagnosis not present

## 2019-05-26 DIAGNOSIS — I129 Hypertensive chronic kidney disease with stage 1 through stage 4 chronic kidney disease, or unspecified chronic kidney disease: Secondary | ICD-10-CM | POA: Diagnosis not present

## 2019-05-26 DIAGNOSIS — N183 Chronic kidney disease, stage 3 (moderate): Secondary | ICD-10-CM | POA: Diagnosis not present

## 2019-05-27 DIAGNOSIS — H524 Presbyopia: Secondary | ICD-10-CM | POA: Diagnosis not present

## 2019-05-27 DIAGNOSIS — H43813 Vitreous degeneration, bilateral: Secondary | ICD-10-CM | POA: Diagnosis not present

## 2019-05-27 DIAGNOSIS — E119 Type 2 diabetes mellitus without complications: Secondary | ICD-10-CM | POA: Diagnosis not present

## 2019-05-27 DIAGNOSIS — H40013 Open angle with borderline findings, low risk, bilateral: Secondary | ICD-10-CM | POA: Diagnosis not present

## 2019-06-22 DIAGNOSIS — F322 Major depressive disorder, single episode, severe without psychotic features: Secondary | ICD-10-CM | POA: Diagnosis not present

## 2019-06-22 DIAGNOSIS — E1122 Type 2 diabetes mellitus with diabetic chronic kidney disease: Secondary | ICD-10-CM | POA: Diagnosis not present

## 2019-06-22 DIAGNOSIS — N400 Enlarged prostate without lower urinary tract symptoms: Secondary | ICD-10-CM | POA: Diagnosis not present

## 2019-06-22 DIAGNOSIS — N1831 Chronic kidney disease, stage 3a: Secondary | ICD-10-CM | POA: Diagnosis not present

## 2019-06-22 DIAGNOSIS — I10 Essential (primary) hypertension: Secondary | ICD-10-CM | POA: Diagnosis not present

## 2019-06-22 DIAGNOSIS — E7800 Pure hypercholesterolemia, unspecified: Secondary | ICD-10-CM | POA: Diagnosis not present

## 2019-07-27 DIAGNOSIS — E7800 Pure hypercholesterolemia, unspecified: Secondary | ICD-10-CM | POA: Diagnosis not present

## 2019-07-27 DIAGNOSIS — F322 Major depressive disorder, single episode, severe without psychotic features: Secondary | ICD-10-CM | POA: Diagnosis not present

## 2019-07-27 DIAGNOSIS — N400 Enlarged prostate without lower urinary tract symptoms: Secondary | ICD-10-CM | POA: Diagnosis not present

## 2019-07-27 DIAGNOSIS — E1122 Type 2 diabetes mellitus with diabetic chronic kidney disease: Secondary | ICD-10-CM | POA: Diagnosis not present

## 2019-07-27 DIAGNOSIS — I10 Essential (primary) hypertension: Secondary | ICD-10-CM | POA: Diagnosis not present

## 2019-08-05 ENCOUNTER — Other Ambulatory Visit: Payer: Self-pay

## 2019-08-05 NOTE — Patient Outreach (Signed)
  [place] [facility]) Care Management Chronic Special Needs Program  08/05/2019  Name: Blake Young. DOB: Dec 09, 1950  MRN: [id number]  Mr. [name] is enrolled in a Chronic Special Needs Plan. RNCM called to follow up and review individualized care plan. No answer. HIPPA compliant message left.   Plan: Chronic care management coordinator will attempt outreach within 2-3 weeks.  Thea Silversmith, RN, MSN, [place] [phone]

## 2019-08-10 ENCOUNTER — Other Ambulatory Visit: Payer: Self-pay

## 2019-08-10 NOTE — Patient Outreach (Signed)
  [place] [facility]) Care Management Chronic Special Needs Program  08/10/2019  Name: Blake Young. DOB: 1951/02/15  MRN: [id number]  [name] is enrolled in a Chronic Special Needs Plan. RNCM called to follow up and review individualized care plan. No answer. HIPPA compliant message left. 2nd outreach attempt.  Plan: Chronic care management coordinator will attempt outreach within 2-3 weeks.   Thea Silversmith, RN, MSN, [place] [phone]

## 2019-08-13 DIAGNOSIS — F322 Major depressive disorder, single episode, severe without psychotic features: Secondary | ICD-10-CM | POA: Diagnosis not present

## 2019-08-13 DIAGNOSIS — I10 Essential (primary) hypertension: Secondary | ICD-10-CM | POA: Diagnosis not present

## 2019-08-13 DIAGNOSIS — E1122 Type 2 diabetes mellitus with diabetic chronic kidney disease: Secondary | ICD-10-CM | POA: Diagnosis not present

## 2019-08-13 DIAGNOSIS — E7800 Pure hypercholesterolemia, unspecified: Secondary | ICD-10-CM | POA: Diagnosis not present

## 2019-08-13 DIAGNOSIS — N400 Enlarged prostate without lower urinary tract symptoms: Secondary | ICD-10-CM | POA: Diagnosis not present

## 2019-08-14 ENCOUNTER — Ambulatory Visit: Payer: HMO

## 2019-08-18 ENCOUNTER — Other Ambulatory Visit: Payer: Self-pay

## 2019-08-18 NOTE — Patient Outreach (Signed)
  [place] [facility]) Care Management Chronic Special Needs Program  08/18/2019  Name: Blake Young. DOB: [birth year]  MRN: [id number]  [name] is enrolled in a chronic special needs plan for Diabetes. RNCM reviewed and updated care plan.  Subjective: Client reports he is doing well. He continues to walk at least 3 miles/day. Client reports, "I have changed my diet a little bit. I am eating more fruits and vegetables". He continues to see providers as scheduled. Last A1C 7.0 on 03/11/2019. Blood sugar today was 140. He denies any hypoglycemic episodes or blood sugar readings less than 70. Client denies any issues or questions at this time.  Goals Addressed            This Visit's Progress   . COMPLETED: Client understands the importance of follow-up with providers by attending scheduled visits       Voiced understanding of importance of attending provider visits.    . COMPLETED: Client will verbalize knowledge of self management of Hypertension as evidences by BP reading of 140/90 or less; or as defined by provider       Takes medication as scheduled; follows up with provider visits; self monitors blood pressure; reports blood pressure less than 140/90.    Marland Kitchen COMPLETED: Maintain timely refills of diabetic medication as prescribed within the year .       Denies any issues with obtaining medications.    . COMPLETED: Obtain annual  Lipid Profile, LDL-C       Done 03/11/2019    . COMPLETED: Obtain Annual Eye (retinal)  Exam        Done 05/27/2019    . COMPLETED: Obtain Annual Foot Exam       Per client done.    . COMPLETED: Obtain annual screen for micro albuminuria (urine) , nephropathy (kidney problems)       Done 03/11/2019    . COMPLETED: Obtain Hemoglobin A1C at least 2 times per year       A1C checked at least twice this year.    . COMPLETED: Visit Primary Care Provider or Endocrinologist at least 2 times per year        Provider seen at least twice this year.      [id number] precautions discussed. RNCM encouraged client to call 24 hour nurse advice line as needed. RNCM encouraged client to call the healthcare concierge as needed for benefits questions. RNCM encouraged client to call RNCM as needed.  Plan: RNCM will send updated care plan to client, send updated care plan to primary care. RNCM will outreach per tier level within the next 9-12 months or sooner if indicated.    Thea Silversmith, RN, MSN, [place] [phone]

## 2019-08-26 DIAGNOSIS — Z20828 Contact with and (suspected) exposure to other viral communicable diseases: Secondary | ICD-10-CM | POA: Diagnosis not present

## 2019-09-15 DIAGNOSIS — E1122 Type 2 diabetes mellitus with diabetic chronic kidney disease: Secondary | ICD-10-CM | POA: Diagnosis not present

## 2019-09-15 DIAGNOSIS — H6120 Impacted cerumen, unspecified ear: Secondary | ICD-10-CM | POA: Diagnosis not present

## 2019-09-15 DIAGNOSIS — I10 Essential (primary) hypertension: Secondary | ICD-10-CM | POA: Diagnosis not present

## 2019-09-15 DIAGNOSIS — N183 Chronic kidney disease, stage 3 unspecified: Secondary | ICD-10-CM | POA: Diagnosis not present

## 2019-09-15 DIAGNOSIS — G4700 Insomnia, unspecified: Secondary | ICD-10-CM | POA: Diagnosis not present

## 2019-09-15 DIAGNOSIS — E7800 Pure hypercholesterolemia, unspecified: Secondary | ICD-10-CM | POA: Diagnosis not present

## 2019-09-23 DIAGNOSIS — E1122 Type 2 diabetes mellitus with diabetic chronic kidney disease: Secondary | ICD-10-CM | POA: Diagnosis not present

## 2019-09-23 DIAGNOSIS — F322 Major depressive disorder, single episode, severe without psychotic features: Secondary | ICD-10-CM | POA: Diagnosis not present

## 2019-09-23 DIAGNOSIS — E7800 Pure hypercholesterolemia, unspecified: Secondary | ICD-10-CM | POA: Diagnosis not present

## 2019-09-23 DIAGNOSIS — I10 Essential (primary) hypertension: Secondary | ICD-10-CM | POA: Diagnosis not present

## 2019-09-23 DIAGNOSIS — N400 Enlarged prostate without lower urinary tract symptoms: Secondary | ICD-10-CM | POA: Diagnosis not present

## 2019-09-29 ENCOUNTER — Ambulatory Visit: Payer: HMO

## 2019-09-29 DIAGNOSIS — H6123 Impacted cerumen, bilateral: Secondary | ICD-10-CM | POA: Diagnosis not present

## 2019-09-29 DIAGNOSIS — H900 Conductive hearing loss, bilateral: Secondary | ICD-10-CM | POA: Diagnosis not present

## 2019-10-08 ENCOUNTER — Ambulatory Visit: Payer: HMO | Attending: Internal Medicine

## 2019-10-08 DIAGNOSIS — Z23 Encounter for immunization: Secondary | ICD-10-CM | POA: Insufficient documentation

## 2019-10-20 ENCOUNTER — Ambulatory Visit: Payer: HMO

## 2019-10-28 DIAGNOSIS — E1122 Type 2 diabetes mellitus with diabetic chronic kidney disease: Secondary | ICD-10-CM | POA: Diagnosis not present

## 2019-10-28 DIAGNOSIS — N400 Enlarged prostate without lower urinary tract symptoms: Secondary | ICD-10-CM | POA: Diagnosis not present

## 2019-10-28 DIAGNOSIS — I10 Essential (primary) hypertension: Secondary | ICD-10-CM | POA: Diagnosis not present

## 2019-10-28 DIAGNOSIS — E7800 Pure hypercholesterolemia, unspecified: Secondary | ICD-10-CM | POA: Diagnosis not present

## 2019-10-28 DIAGNOSIS — F322 Major depressive disorder, single episode, severe without psychotic features: Secondary | ICD-10-CM | POA: Diagnosis not present

## 2019-11-02 ENCOUNTER — Ambulatory Visit: Payer: Self-pay

## 2019-11-02 ENCOUNTER — Ambulatory Visit: Payer: HMO

## 2019-11-02 ENCOUNTER — Ambulatory Visit: Payer: HMO | Attending: Internal Medicine

## 2019-11-02 DIAGNOSIS — Z23 Encounter for immunization: Secondary | ICD-10-CM | POA: Insufficient documentation

## 2019-11-02 NOTE — Progress Notes (Signed)
   [id number]  Name:  Alger Aston.    MRN: [id number] DOB: February 28, 1951  11/02/2019  Mr. Himmelreich was observed post Covid-19 immunization for 15 minutes without incidence. He was provided with Vaccine Information Sheet and instruction to access the V-Safe system.   Mr. Catton was instructed to call 911 with any severe reactions post vaccine: Marland Kitchen Difficulty breathing  . Swelling of your face and throat  . A fast heartbeat  . A bad rash all over your body  . Dizziness and weakness    Immunizations Administered    Name Date Dose VIS Date Route   Pfizer COVID-19 Vaccine 11/02/2019  3:55 PM 0.3 mL 08/14/2019 Intramuscular   Manufacturer: [place]   Lot: [id number]   [place]: [id number]

## 2019-11-23 ENCOUNTER — Ambulatory Visit
Admission: RE | Admit: 2019-11-23 | Discharge: 2019-11-23 | Disposition: A | Discharge status: Home / Self Care | Payer: HMO | Source: Ambulatory Visit | Attending: Internal Medicine | Admitting: Internal Medicine

## 2019-11-23 ENCOUNTER — Other Ambulatory Visit: Payer: Self-pay | Admitting: Internal Medicine

## 2019-11-23 DIAGNOSIS — G4700 Insomnia, unspecified: Secondary | ICD-10-CM | POA: Diagnosis not present

## 2019-11-23 DIAGNOSIS — E7800 Pure hypercholesterolemia, unspecified: Secondary | ICD-10-CM | POA: Diagnosis not present

## 2019-11-23 DIAGNOSIS — E1122 Type 2 diabetes mellitus with diabetic chronic kidney disease: Secondary | ICD-10-CM | POA: Diagnosis not present

## 2019-11-23 DIAGNOSIS — M1712 Unilateral primary osteoarthritis, left knee: Secondary | ICD-10-CM | POA: Diagnosis not present

## 2019-11-23 DIAGNOSIS — N183 Chronic kidney disease, stage 3 unspecified: Secondary | ICD-10-CM | POA: Diagnosis not present

## 2019-11-23 DIAGNOSIS — N400 Enlarged prostate without lower urinary tract symptoms: Secondary | ICD-10-CM | POA: Diagnosis not present

## 2019-11-23 DIAGNOSIS — I10 Essential (primary) hypertension: Secondary | ICD-10-CM | POA: Diagnosis not present

## 2019-11-23 DIAGNOSIS — F322 Major depressive disorder, single episode, severe without psychotic features: Secondary | ICD-10-CM | POA: Diagnosis not present

## 2019-12-28 DIAGNOSIS — H900 Conductive hearing loss, bilateral: Secondary | ICD-10-CM | POA: Diagnosis not present

## 2019-12-28 DIAGNOSIS — N183 Chronic kidney disease, stage 3 unspecified: Secondary | ICD-10-CM | POA: Diagnosis not present

## 2019-12-28 DIAGNOSIS — I10 Essential (primary) hypertension: Secondary | ICD-10-CM | POA: Diagnosis not present

## 2019-12-28 DIAGNOSIS — E1122 Type 2 diabetes mellitus with diabetic chronic kidney disease: Secondary | ICD-10-CM | POA: Diagnosis not present

## 2019-12-28 DIAGNOSIS — M1712 Unilateral primary osteoarthritis, left knee: Secondary | ICD-10-CM | POA: Diagnosis not present

## 2019-12-28 DIAGNOSIS — F322 Major depressive disorder, single episode, severe without psychotic features: Secondary | ICD-10-CM | POA: Diagnosis not present

## 2019-12-28 DIAGNOSIS — N400 Enlarged prostate without lower urinary tract symptoms: Secondary | ICD-10-CM | POA: Diagnosis not present

## 2019-12-29 DIAGNOSIS — N183 Chronic kidney disease, stage 3 unspecified: Secondary | ICD-10-CM | POA: Diagnosis not present

## 2019-12-29 DIAGNOSIS — N400 Enlarged prostate without lower urinary tract symptoms: Secondary | ICD-10-CM | POA: Diagnosis not present

## 2019-12-29 DIAGNOSIS — F322 Major depressive disorder, single episode, severe without psychotic features: Secondary | ICD-10-CM | POA: Diagnosis not present

## 2019-12-29 DIAGNOSIS — M1712 Unilateral primary osteoarthritis, left knee: Secondary | ICD-10-CM | POA: Diagnosis not present

## 2019-12-29 DIAGNOSIS — E1122 Type 2 diabetes mellitus with diabetic chronic kidney disease: Secondary | ICD-10-CM | POA: Diagnosis not present

## 2019-12-29 DIAGNOSIS — I10 Essential (primary) hypertension: Secondary | ICD-10-CM | POA: Diagnosis not present

## 2019-12-29 DIAGNOSIS — G4700 Insomnia, unspecified: Secondary | ICD-10-CM | POA: Diagnosis not present

## 2019-12-29 DIAGNOSIS — E7800 Pure hypercholesterolemia, unspecified: Secondary | ICD-10-CM | POA: Diagnosis not present

## 2019-12-31 ENCOUNTER — Ambulatory Visit (INDEPENDENT_AMBULATORY_CARE_PROVIDER_SITE_OTHER): Payer: HMO | Admitting: Physician Assistant

## 2019-12-31 ENCOUNTER — Encounter: Payer: Self-pay | Admitting: Physician Assistant

## 2019-12-31 ENCOUNTER — Other Ambulatory Visit: Payer: Self-pay

## 2019-12-31 DIAGNOSIS — F411 Generalized anxiety disorder: Secondary | ICD-10-CM

## 2019-12-31 DIAGNOSIS — G4700 Insomnia, unspecified: Secondary | ICD-10-CM | POA: Diagnosis not present

## 2019-12-31 DIAGNOSIS — F3341 Major depressive disorder, recurrent, in partial remission: Secondary | ICD-10-CM | POA: Diagnosis not present

## 2019-12-31 MED ORDER — ALPRAZOLAM 0.5 MG PO TABS: 0.5000 mg | ORAL_TABLET | Freq: Every evening | ORAL | 1 refills | Status: DC | PRN

## 2019-12-31 NOTE — Progress Notes (Signed)
[facility]  Patient ID: Blake Laveck.,  MRN: [id number]  PCP: Wenda Low, MD  Date of Evaluation: 12/31/2019 Time spent:20 minutes  Chief Complaint:  Chief Complaint    Anxiety; Depression; Insomnia      HISTORY/CURRENT STATUS: HPI For routine med check.  He's changed the Wellbutrin XL to SR, his PCP gave him the new Rx until he could see me, d/t cost.  Will be in the donut hole soon and he's tried to cut back on some of his Rx cost down.  Has felt a little down for the past 3-4 months. Just not wanting to do as much.  He does read a lot and is in a book club, talks to his family often.  So none of that has changed.  He does enjoy those things.  Energy and motivation are good most of the time.  He still has trouble sleeping and has recently taken Xanax, not prescribed, which was very helpful.  He slept maybe 6 to 7 hours and felt much more rested when he got up.  Even the Sonata which has been the last prescription sleep aid we have given him, does not help but for maybe 3 hours.  Then he would get amnesia if he took a second [place].  He would not remember that he was up in the night doing things.  No suicidal or homicidal thoughts.  Patient denies increased energy with decreased need for sleep, no increased talkativeness, no racing thoughts, no impulsivity or risky behaviors, no increased spending, no increased libido, no grandiosity, no increased irritability or anger, and no hallucinations.  He is not really having racing thoughts but when he wakes up during the night he will often have disturbing dreams that he can remember.  They are not nightmares but they do cause anxiety for a little while.  Denies dizziness, syncope, seizures, numbness, tingling, tremor, tics, unsteady gait, slurred speech, confusion. Denies muscle or joint pain, stiffness, or dystonia.  Individual Medical History/ Review of Systems: Changes? :No    Past medications for mental health  diagnoses include: Cymbalta, Wellbutrin, Deplin, Elavil, Pamelor, Serzone, Nardil, Depakote, Prozac, Zoloft, Celexa, trazodone, Effexor, lithium, Abilify, Lamictal, mirtazapine, Lunesta, Restoril, Sonata, Xanax.  Allergies: Patient has no known allergies.  Current Medications:  Current Outpatient Medications:  .  acetaminophen (TYLENOL) 500 MG tablet, Take 500 mg by mouth., Disp: , Rfl:  .  amLODipine (NORVASC) 2.5 MG tablet, Take 2.5 mg by mouth daily., Disp: , Rfl:  .  aspirin EC 81 MG tablet, Take 81 mg by mouth daily., Disp: , Rfl:  .  buPROPion (WELLBUTRIN SR) 200 MG 12 hr tablet, Take 200 mg by mouth 2 (two) times daily., Disp: , Rfl:  .  famotidine (PEPCID) 20 MG tablet, 20 mg daily. , Disp: , Rfl:  .  gabapentin (NEURONTIN) 300 MG capsule, Take 300 mg by mouth daily., Disp: , Rfl:  .  glipiZIDE (GLUCOTROL XL) 2.5 MG 24 hr tablet, TAKE 1 TABLET BY MOUTH ONCE DAILY IN THE MORNING WITH BREAKFAST, Disp: , Rfl:  .  loratadine (CLARITIN) 10 MG tablet, Take 10 mg by mouth daily as needed for allergies., Disp: , Rfl:  .  lovastatin (MEVACOR) 40 MG tablet, Take 40 mg by mouth at bedtime. , Disp: , Rfl:  .  Multiple Vitamins-Minerals (EMERGEN-C IMMUNE PO), Take by mouth as needed., Disp: , Rfl:  .  sildenafil (VIAGRA) 100 MG tablet, Take 100 mg by mouth as needed for erectile dysfunction., Disp: ,  Rfl:  .  ALPRAZolam (XANAX) 0.5 MG tablet, Take 1-2 tablets (0.5-1 mg total) by mouth at bedtime as needed for anxiety., Disp: 60 tablet, Rfl: 1 .  fluticasone (FLONASE) 50 MCG/ACT nasal spray, Place into both nostrils as needed for allergies or rhinitis., Disp: , Rfl:  .  hydrocortisone 2.5 % cream, Apply topically as needed., Disp: , Rfl:  .  JANUVIA 50 MG tablet, Take 50 mg by mouth daily., Disp: , Rfl:  .  lisinopril (PRINIVIL,ZESTRIL) 20 MG tablet, Take 20 mg by mouth daily., Disp: , Rfl:  .  metFORMIN (GLUCOPHAGE) 1000 MG tablet, Take 1,000 mg by mouth 2 (two) times daily with a meal., Disp: ,  Rfl:  .  oxyCODONE-acetaminophen (ROXICET) 5-325 MG tablet, Take 1-2 tablets by mouth every 6 (six) hours as needed for severe pain. (Patient not taking: Reported on 12/22/2018), Disp: 12 tablet, Rfl: 0 .  TRULICITY [id number] SOPN, , Disp: , Rfl:  Medication Side Effects: none  Family Medical/ Social History: Changes? No  MENTAL HEALTH EXAM:  There were no vitals taken for this visit.There is no height or weight on file to calculate BMI.  General Appearance: Casual, Neat and Well Groomed  Eye Contact:  Good  Speech:  Clear and Coherent and Normal Rate  Volume:  Normal  Mood:  Euthymic  Affect:  Appropriate  Thought Process:  Goal Directed and Descriptions of Associations: Intact  Orientation:  Full (Time, Place, and Person)  Thought Content: Logical   Suicidal Thoughts:  No  Homicidal Thoughts:  No  Memory:  WNL  Judgement:  Good  Insight:  Good  Psychomotor Activity:  Normal  Concentration:  Concentration: Good  Recall:  Good  Fund of Knowledge: Good  Language: Good  Assets:  Desire for Improvement  ADL's:  Intact  Cognition: WNL  Prognosis:  Good    DIAGNOSES:    ICD-10-CM   1. Insomnia, unspecified type  G47.00   2. Recurrent major depressive disorder, in partial remission ([place])  F33.41   3. Generalized anxiety disorder  F41.1     Receiving Psychotherapy: No    RECOMMENDATIONS:  PDMP was reviewed. I spent 20 minutes with him. We discussed at the Xanax.  It is fine for him to use this for sleep. Discontinue Sonata. Start Xanax 0.5 mg, 1-2 nightly as needed for anxiety or sleep.  Okay to refill if effective. Continue Wellbutrin SR 200 mg, 1 p.o. twice daily.  His PCP has prescribed it most recently.  I will prescribe if he needs me to. Return in 6 months  Donnal Moat, [place]

## 2020-01-19 DIAGNOSIS — N400 Enlarged prostate without lower urinary tract symptoms: Secondary | ICD-10-CM | POA: Diagnosis not present

## 2020-01-19 DIAGNOSIS — M1712 Unilateral primary osteoarthritis, left knee: Secondary | ICD-10-CM | POA: Diagnosis not present

## 2020-01-19 DIAGNOSIS — E7800 Pure hypercholesterolemia, unspecified: Secondary | ICD-10-CM | POA: Diagnosis not present

## 2020-01-19 DIAGNOSIS — F322 Major depressive disorder, single episode, severe without psychotic features: Secondary | ICD-10-CM | POA: Diagnosis not present

## 2020-01-19 DIAGNOSIS — E1122 Type 2 diabetes mellitus with diabetic chronic kidney disease: Secondary | ICD-10-CM | POA: Diagnosis not present

## 2020-01-19 DIAGNOSIS — N183 Chronic kidney disease, stage 3 unspecified: Secondary | ICD-10-CM | POA: Diagnosis not present

## 2020-01-19 DIAGNOSIS — G4700 Insomnia, unspecified: Secondary | ICD-10-CM | POA: Diagnosis not present

## 2020-01-19 DIAGNOSIS — I10 Essential (primary) hypertension: Secondary | ICD-10-CM | POA: Diagnosis not present

## 2020-02-12 DIAGNOSIS — E7800 Pure hypercholesterolemia, unspecified: Secondary | ICD-10-CM | POA: Diagnosis not present

## 2020-02-12 DIAGNOSIS — E1122 Type 2 diabetes mellitus with diabetic chronic kidney disease: Secondary | ICD-10-CM | POA: Diagnosis not present

## 2020-02-12 DIAGNOSIS — I10 Essential (primary) hypertension: Secondary | ICD-10-CM | POA: Diagnosis not present

## 2020-02-12 DIAGNOSIS — M1712 Unilateral primary osteoarthritis, left knee: Secondary | ICD-10-CM | POA: Diagnosis not present

## 2020-02-12 DIAGNOSIS — G4700 Insomnia, unspecified: Secondary | ICD-10-CM | POA: Diagnosis not present

## 2020-02-12 DIAGNOSIS — N400 Enlarged prostate without lower urinary tract symptoms: Secondary | ICD-10-CM | POA: Diagnosis not present

## 2020-02-12 DIAGNOSIS — F322 Major depressive disorder, single episode, severe without psychotic features: Secondary | ICD-10-CM | POA: Diagnosis not present

## 2020-02-12 DIAGNOSIS — N183 Chronic kidney disease, stage 3 unspecified: Secondary | ICD-10-CM | POA: Diagnosis not present

## 2020-03-08 DIAGNOSIS — N183 Chronic kidney disease, stage 3 unspecified: Secondary | ICD-10-CM | POA: Diagnosis not present

## 2020-03-08 DIAGNOSIS — E1122 Type 2 diabetes mellitus with diabetic chronic kidney disease: Secondary | ICD-10-CM | POA: Diagnosis not present

## 2020-03-08 DIAGNOSIS — M1712 Unilateral primary osteoarthritis, left knee: Secondary | ICD-10-CM | POA: Diagnosis not present

## 2020-03-08 DIAGNOSIS — N400 Enlarged prostate without lower urinary tract symptoms: Secondary | ICD-10-CM | POA: Diagnosis not present

## 2020-03-08 DIAGNOSIS — F322 Major depressive disorder, single episode, severe without psychotic features: Secondary | ICD-10-CM | POA: Diagnosis not present

## 2020-03-08 DIAGNOSIS — E7800 Pure hypercholesterolemia, unspecified: Secondary | ICD-10-CM | POA: Diagnosis not present

## 2020-03-08 DIAGNOSIS — I10 Essential (primary) hypertension: Secondary | ICD-10-CM | POA: Diagnosis not present

## 2020-03-08 DIAGNOSIS — G4700 Insomnia, unspecified: Secondary | ICD-10-CM | POA: Diagnosis not present

## 2020-03-15 DIAGNOSIS — F322 Major depressive disorder, single episode, severe without psychotic features: Secondary | ICD-10-CM | POA: Diagnosis not present

## 2020-03-15 DIAGNOSIS — N183 Chronic kidney disease, stage 3 unspecified: Secondary | ICD-10-CM | POA: Diagnosis not present

## 2020-03-15 DIAGNOSIS — E1122 Type 2 diabetes mellitus with diabetic chronic kidney disease: Secondary | ICD-10-CM | POA: Diagnosis not present

## 2020-03-15 DIAGNOSIS — N400 Enlarged prostate without lower urinary tract symptoms: Secondary | ICD-10-CM | POA: Diagnosis not present

## 2020-03-15 DIAGNOSIS — I10 Essential (primary) hypertension: Secondary | ICD-10-CM | POA: Diagnosis not present

## 2020-03-15 DIAGNOSIS — G4700 Insomnia, unspecified: Secondary | ICD-10-CM | POA: Diagnosis not present

## 2020-03-15 DIAGNOSIS — E7800 Pure hypercholesterolemia, unspecified: Secondary | ICD-10-CM | POA: Diagnosis not present

## 2020-03-15 DIAGNOSIS — Z0000 Encounter for general adult medical examination without abnormal findings: Secondary | ICD-10-CM | POA: Diagnosis not present

## 2020-03-15 DIAGNOSIS — G4733 Obstructive sleep apnea (adult) (pediatric): Secondary | ICD-10-CM | POA: Diagnosis not present

## 2020-03-15 DIAGNOSIS — K219 Gastro-esophageal reflux disease without esophagitis: Secondary | ICD-10-CM | POA: Diagnosis not present

## 2020-03-15 DIAGNOSIS — J309 Allergic rhinitis, unspecified: Secondary | ICD-10-CM | POA: Diagnosis not present

## 2020-04-13 DIAGNOSIS — I10 Essential (primary) hypertension: Secondary | ICD-10-CM | POA: Diagnosis not present

## 2020-04-13 DIAGNOSIS — E7800 Pure hypercholesterolemia, unspecified: Secondary | ICD-10-CM | POA: Diagnosis not present

## 2020-04-13 DIAGNOSIS — E1122 Type 2 diabetes mellitus with diabetic chronic kidney disease: Secondary | ICD-10-CM | POA: Diagnosis not present

## 2020-04-13 DIAGNOSIS — N400 Enlarged prostate without lower urinary tract symptoms: Secondary | ICD-10-CM | POA: Diagnosis not present

## 2020-04-13 DIAGNOSIS — G4700 Insomnia, unspecified: Secondary | ICD-10-CM | POA: Diagnosis not present

## 2020-04-13 DIAGNOSIS — N183 Chronic kidney disease, stage 3 unspecified: Secondary | ICD-10-CM | POA: Diagnosis not present

## 2020-04-13 DIAGNOSIS — Z7984 Long term (current) use of oral hypoglycemic drugs: Secondary | ICD-10-CM | POA: Diagnosis not present

## 2020-04-13 DIAGNOSIS — M1712 Unilateral primary osteoarthritis, left knee: Secondary | ICD-10-CM | POA: Diagnosis not present

## 2020-04-13 DIAGNOSIS — F322 Major depressive disorder, single episode, severe without psychotic features: Secondary | ICD-10-CM | POA: Diagnosis not present

## 2020-04-20 IMAGING — CR DG KNEE 1-2V*L*
2 series · 2 of 2 positions shown · non-contrast
Comparison: None.

CLINICAL DATA: Arthritis

EXAM:
LEFT KNEE - 1-2 VIEW

[t knee ap left]
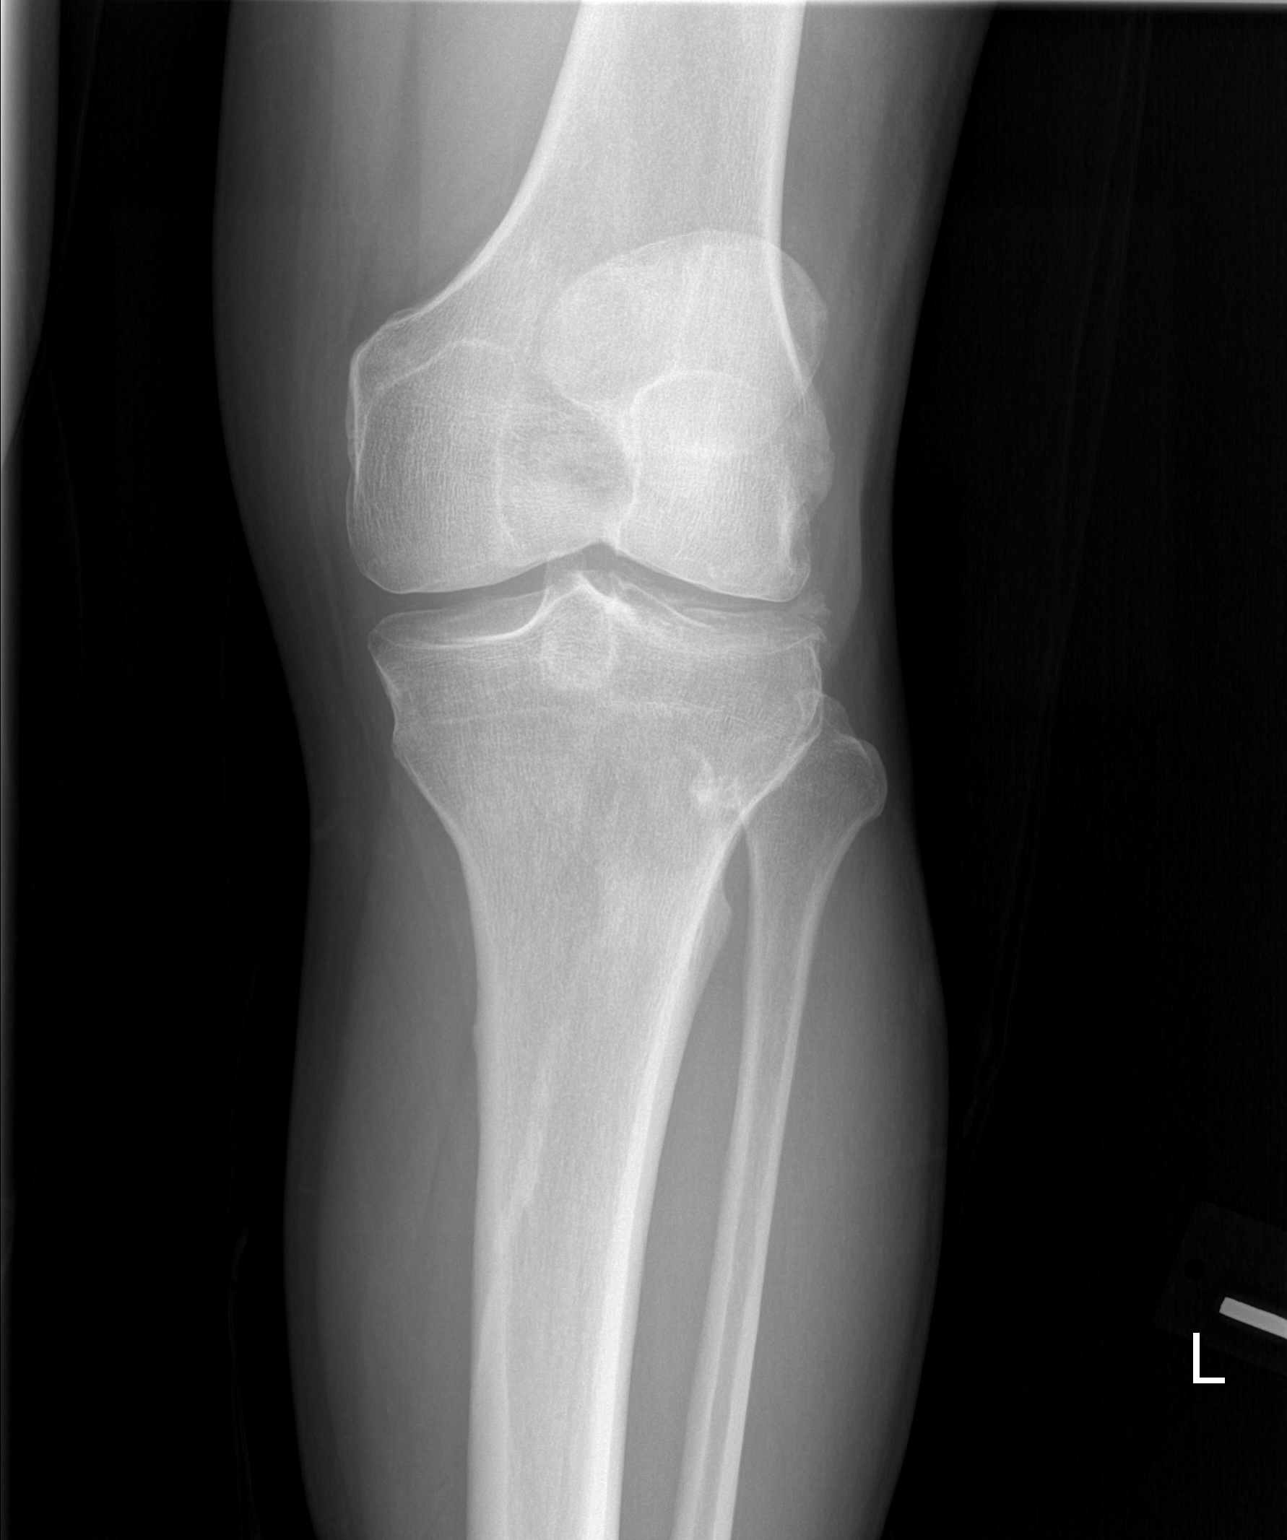

[t knee lat left]
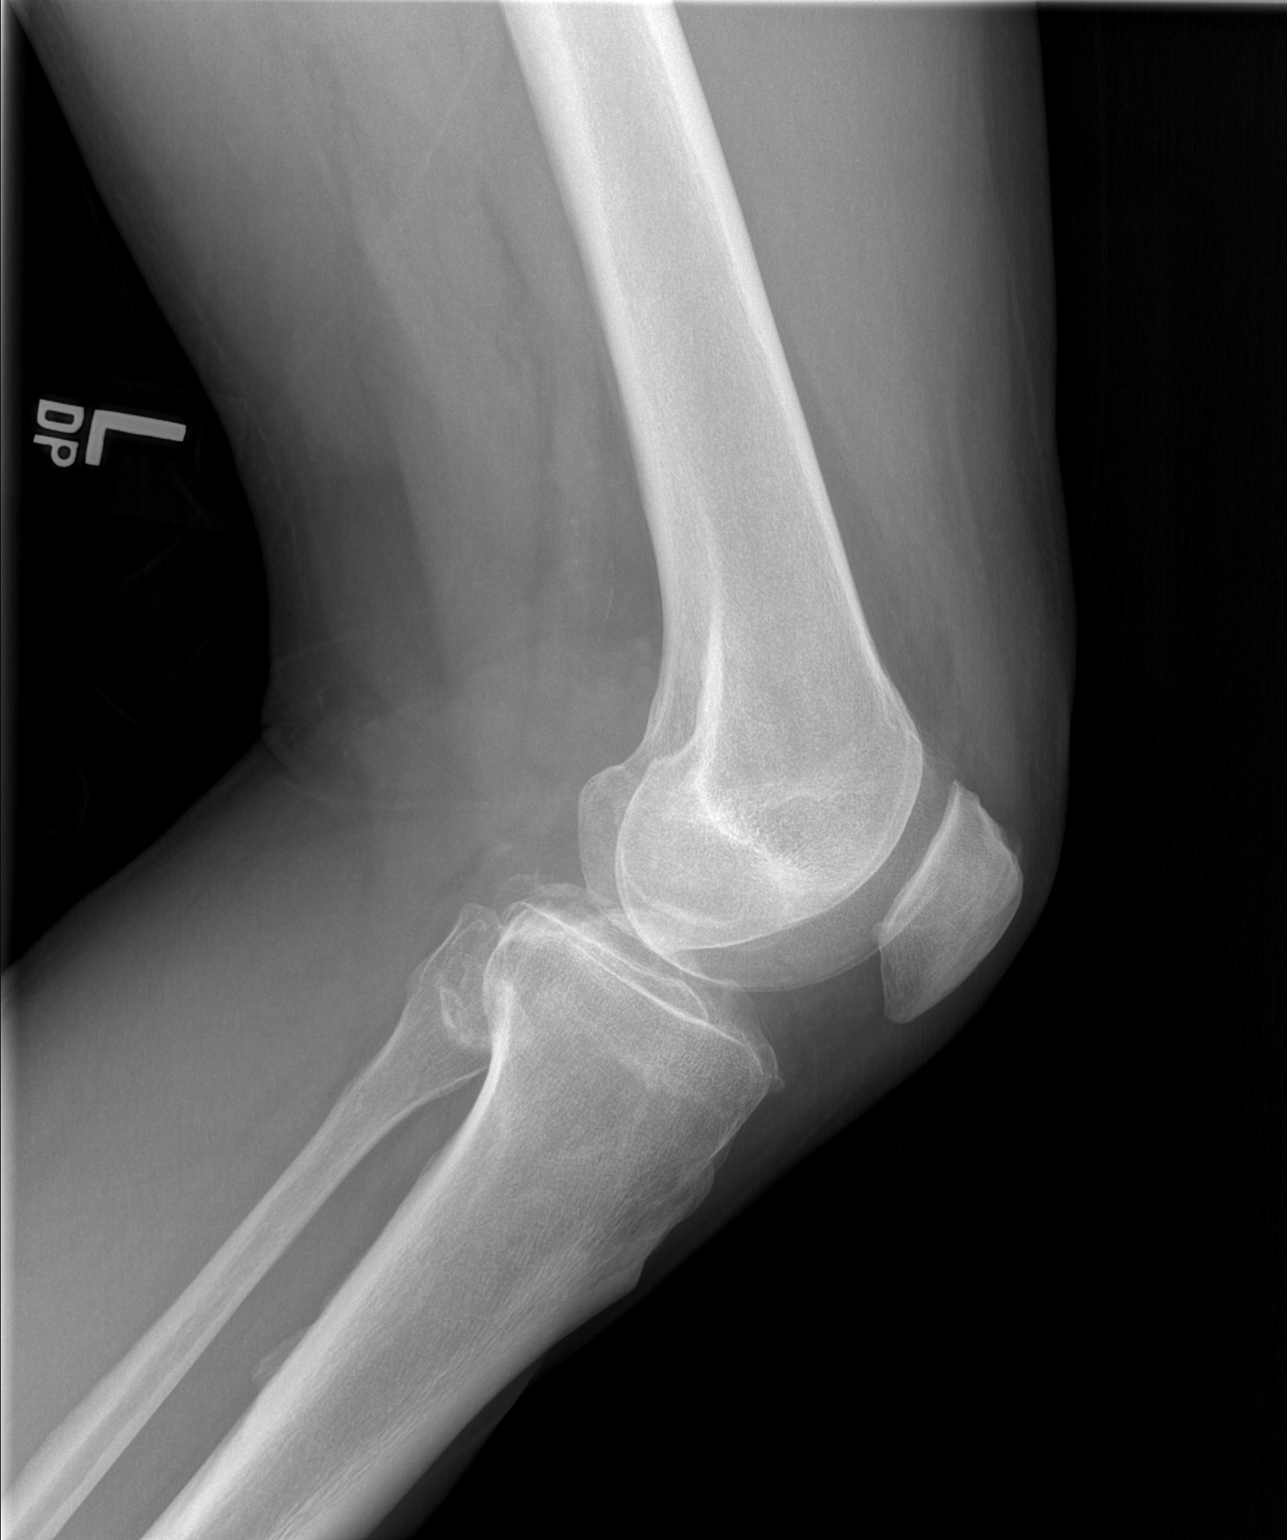

[2 of 2 positions shown; findings below may reference images not displayed]

FINDINGS: There is no joint effusion. No fracture or subluxation identified.
Normal appearance of the patellofemoral compartment. Sharpening of
the tibial spines noted. There is lateral compartment marginal spur
formation and chondrocalcinosis.
IMPRESSION: Osteoarthritis and chondrocalcinosis.

## 2020-05-23 DIAGNOSIS — M1712 Unilateral primary osteoarthritis, left knee: Secondary | ICD-10-CM | POA: Diagnosis not present

## 2020-05-23 DIAGNOSIS — I10 Essential (primary) hypertension: Secondary | ICD-10-CM | POA: Diagnosis not present

## 2020-05-23 DIAGNOSIS — N183 Chronic kidney disease, stage 3 unspecified: Secondary | ICD-10-CM | POA: Diagnosis not present

## 2020-05-23 DIAGNOSIS — G4700 Insomnia, unspecified: Secondary | ICD-10-CM | POA: Diagnosis not present

## 2020-05-23 DIAGNOSIS — F322 Major depressive disorder, single episode, severe without psychotic features: Secondary | ICD-10-CM | POA: Diagnosis not present

## 2020-05-23 DIAGNOSIS — E7800 Pure hypercholesterolemia, unspecified: Secondary | ICD-10-CM | POA: Diagnosis not present

## 2020-05-23 DIAGNOSIS — N400 Enlarged prostate without lower urinary tract symptoms: Secondary | ICD-10-CM | POA: Diagnosis not present

## 2020-05-23 DIAGNOSIS — E1122 Type 2 diabetes mellitus with diabetic chronic kidney disease: Secondary | ICD-10-CM | POA: Diagnosis not present

## 2020-05-27 DIAGNOSIS — N183 Chronic kidney disease, stage 3 unspecified: Secondary | ICD-10-CM | POA: Diagnosis not present

## 2020-05-27 DIAGNOSIS — H43813 Vitreous degeneration, bilateral: Secondary | ICD-10-CM | POA: Diagnosis not present

## 2020-05-27 DIAGNOSIS — H40013 Open angle with borderline findings, low risk, bilateral: Secondary | ICD-10-CM | POA: Diagnosis not present

## 2020-05-27 DIAGNOSIS — E119 Type 2 diabetes mellitus without complications: Secondary | ICD-10-CM | POA: Diagnosis not present

## 2020-05-27 DIAGNOSIS — H524 Presbyopia: Secondary | ICD-10-CM | POA: Diagnosis not present

## 2020-05-30 ENCOUNTER — Other Ambulatory Visit: Payer: Self-pay

## 2020-05-30 NOTE — Patient Outreach (Signed)
  [facility]) Care Management Chronic Special Needs Program    05/30/2020  Name: Lawrnce Reyez., DOB: [birth year]  MRN: [id number]   [name] is enrolled in a chronic special needs plan. RNCM received message from client stating his mother recently passed. Client request to reschedule telephone outreach.  Plan: Outreach rescheduled.  Thea Silversmith, RN, MSN, [place] [phone]

## 2020-05-31 ENCOUNTER — Ambulatory Visit: Payer: Self-pay

## 2020-06-05 ENCOUNTER — Other Ambulatory Visit: Payer: Self-pay | Admitting: Physician Assistant

## 2020-06-07 NOTE — Telephone Encounter (Signed)
Apt 10/26

## 2020-06-20 ENCOUNTER — Other Ambulatory Visit: Payer: Self-pay

## 2020-06-20 NOTE — Patient Outreach (Signed)
Received a Pharmacy referral for a [employer] patient. The Primary Care Physician is using [facility] services.   I have sent an email referral NO:[email or web address] [place] with [facility].

## 2020-06-20 NOTE — Patient Outreach (Signed)
  [place] [facility]) Care Management Chronic Special Needs Program  06/20/2020  Name: Blake Young. DOB: Dec 16, 1950  MRN: [id number]  [name] is enrolled in a chronic special needs plan for Diabetes.RNCM called to follow up, review and updated care plan.   Subjective: client reports the recent loss of his mother. He reports he is also going back and forth to assit with his father as needed. RNCM offered social work referral, but client declines adding he is able to use hospice grief counseling if needed, but does not think this is needed at this time. Client reports blood sugar today was 146.  Client reports he is on contact with pharmacist at provider office that is assisting him with management of his blood sugar and that he plans to schedule to get his next A1C checked. Upcoming appointment with primary care provider January 2022.  Goals Addressed            This Visit's Progress   . COMPLETED: Client will verbalize knowledge of self management of Hypertension as evidences by BP reading of 140/90 or less; or as defined by provider       Reports blood pressure consistently less than 140/90. States last blood pressure 128/80.Marland Kitchen attends provider visits and takes medications as prescribed.    Marland Kitchen HEMOGLOBIN A1C < 7       A1C 7.7 on 03/15/2020.  Continue Diabetes self management actions: Glucose monitoring per provider recommendations Eat Healthy: low carbohydrate and low salt meals; monitor portion sizes and avoid sugar sweetened drinks. Visit provider every 3-6 months as directed Hbg A1C level every 3-6 months. Take medications as prescribed. Attend provider visits as recommended.    . COMPLETED: Obtain annual  Lipid Profile, LDL-C       Done 03/15/2020    . COMPLETED: Obtain Annual Eye (retinal)  Exam        Done 05/27/2020    . COMPLETED: Obtain Annual Foot Exam       Per client done- 03/15/2020.    Marland Kitchen COMPLETED: Obtain annual screen for micro albuminuria (urine)  , nephropathy (kidney problems)       Done 03/15/2020    . Obtain Hemoglobin A1C at least 2 times per year   On track    Client reports A1C checked at least twice a year. Last checked July 2020 A1C 7.7.  Discussed A1C and encouraged client to get A1C checked as recommended. Please continue to work with Pharmacist at provider office and schedule to get A1C checked per recommendations.    . COMPLETED: Visit Primary Care Provider or Endocrinologist at least 2 times per year        Client reports sees provider at least twice a year.        Plan: send updated care plan to client; send updated care plan to primary care provider. Next outreach within the next 12 months.    Thea Silversmith, RN, MSN, [place] [phone]

## 2020-06-21 DIAGNOSIS — I129 Hypertensive chronic kidney disease with stage 1 through stage 4 chronic kidney disease, or unspecified chronic kidney disease: Secondary | ICD-10-CM | POA: Diagnosis not present

## 2020-06-21 DIAGNOSIS — N183 Chronic kidney disease, stage 3 unspecified: Secondary | ICD-10-CM | POA: Diagnosis not present

## 2020-06-21 DIAGNOSIS — E1122 Type 2 diabetes mellitus with diabetic chronic kidney disease: Secondary | ICD-10-CM | POA: Diagnosis not present

## 2020-06-28 ENCOUNTER — Telehealth: Payer: Self-pay | Admitting: Physician Assistant

## 2020-06-28 ENCOUNTER — Telehealth (INDEPENDENT_AMBULATORY_CARE_PROVIDER_SITE_OTHER): Payer: HMO | Admitting: Physician Assistant

## 2020-06-28 ENCOUNTER — Encounter: Payer: Self-pay | Admitting: Physician Assistant

## 2020-06-28 DIAGNOSIS — F4321 Adjustment disorder with depressed mood: Secondary | ICD-10-CM | POA: Diagnosis not present

## 2020-06-28 DIAGNOSIS — E7800 Pure hypercholesterolemia, unspecified: Secondary | ICD-10-CM | POA: Diagnosis not present

## 2020-06-28 DIAGNOSIS — I10 Essential (primary) hypertension: Secondary | ICD-10-CM | POA: Diagnosis not present

## 2020-06-28 DIAGNOSIS — N400 Enlarged prostate without lower urinary tract symptoms: Secondary | ICD-10-CM | POA: Diagnosis not present

## 2020-06-28 DIAGNOSIS — F3341 Major depressive disorder, recurrent, in partial remission: Secondary | ICD-10-CM | POA: Diagnosis not present

## 2020-06-28 DIAGNOSIS — N183 Chronic kidney disease, stage 3 unspecified: Secondary | ICD-10-CM | POA: Diagnosis not present

## 2020-06-28 DIAGNOSIS — F322 Major depressive disorder, single episode, severe without psychotic features: Secondary | ICD-10-CM | POA: Diagnosis not present

## 2020-06-28 DIAGNOSIS — G4700 Insomnia, unspecified: Secondary | ICD-10-CM

## 2020-06-28 DIAGNOSIS — E1122 Type 2 diabetes mellitus with diabetic chronic kidney disease: Secondary | ICD-10-CM | POA: Diagnosis not present

## 2020-06-28 DIAGNOSIS — M1712 Unilateral primary osteoarthritis, left knee: Secondary | ICD-10-CM | POA: Diagnosis not present

## 2020-06-28 NOTE — Progress Notes (Signed)
[facility]  Patient ID: Blake Hagin.,  MRN: [id number]  PCP: Wenda Low, MD  Date of Evaluation: 06/28/2020 Time spent:20 minutes  Chief Complaint:  Chief Complaint    Depression     Virtual Visit via Telehealth  I connected with patient by telephone, with their informed consent, and verified patient privacy and that I am speaking with the correct person using two identifiers.  I am private, in my office and the patient is at home.  I discussed the limitations, risks, security and privacy concerns of performing an evaluation and management service by telephone and the availability of in person appointments. I also discussed with the patient that there may be a patient responsible charge related to this service. The patient expressed understanding and agreed to proceed.   I discussed the assessment and treatment plan with the patient. The patient was provided an opportunity to ask questions and all were answered. The patient agreed with the plan and demonstrated an understanding of the instructions.   The patient was advised to call back or seek an in-person evaluation if the symptoms worsen or if the condition fails to improve as anticipated.  I provided 20 minutes of non-face-to-face time during this encounter.  HISTORY/CURRENT STATUS: HPI For routine med check.  Mom died about a month ago.  Had alzheimers, 88, in a nursing home. "I have my moments, b/c it doesn't matter when your mother dies, it makes you sad, but it's a blessing in a way.  She's no longer suffering.  The thing that's the hardest is watching my Dad.  They were married 24 years. It's hard to see him grieving so bad."  Other than that, he's been doing well. He has been spending a lot of time in [place] with his parents, and now his dad. Hasn't had a lot of time to do anything else. Energy and motivation are ok. Sleeping fairly good. No SI/HI.   Patient denies increased energy with decreased  need for sleep, no increased talkativeness, no racing thoughts, no impulsivity or risky behaviors, no increased spending, no increased libido, no grandiosity, no increased irritability or anger, and no hallucinations.  Denies dizziness, syncope, seizures, numbness, tingling, tremor, tics, unsteady gait, slurred speech, confusion. Denies muscle or joint pain, stiffness, or dystonia.  Individual Medical History/ Review of Systems: Changes? :No    Past medications for mental health diagnoses include: Cymbalta, Wellbutrin, Deplin, Elavil, Pamelor, Serzone, Nardil, Depakote, Prozac, Zoloft, Celexa, trazodone, Effexor, lithium, Abilify, Lamictal, mirtazapine, Lunesta, Restoril, Sonata, Xanax.  Allergies: Flomax [tamsulosin hcl], Ozempic (0.25 or 0.5 mg-dose) [semaglutide(0.25 or 0.5mg -dos)], Trulicity [dulaglutide], and Victoza [liraglutide]  Current Medications:  Current Outpatient Medications:  .  acetaminophen (TYLENOL) 500 MG tablet, Take 500 mg by mouth., Disp: , Rfl:  .  ALPRAZolam (XANAX) 0.5 MG tablet, TAKE 1 TO 2 TABLETS BY MOUTH EVERYDAY AT BEDTIME AS NEEDED FOR ANXIETY, Disp: 60 tablet, Rfl: 1 .  amLODipine (NORVASC) 2.5 MG tablet, Take 2.5 mg by mouth daily., Disp: , Rfl:  .  aspirin EC 81 MG tablet, Take 81 mg by mouth daily., Disp: , Rfl:  .  bismuth subsalicylate (PEPTO BISMOL) 262 MG chewable tablet, Chew 524 mg by mouth as needed., Disp: , Rfl:  .  buPROPion (WELLBUTRIN SR) 200 MG 12 hr tablet, Take 200 mg by mouth 2 (two) times daily., Disp: , Rfl:  .  famotidine (PEPCID) 20 MG tablet, 20 mg daily. , Disp: , Rfl:  .  gabapentin (NEURONTIN) 300 MG capsule,  Take 300 mg by mouth daily., Disp: , Rfl:  .  glipiZIDE (GLUCOTROL XL) 2.5 MG 24 hr tablet, TAKE 1 TABLET BY MOUTH ONCE DAILY IN THE MORNING WITH BREAKFAST, Disp: , Rfl:  .  hydrocortisone 2.5 % cream, Apply topically as needed., Disp: , Rfl:  .  JANUVIA 50 MG tablet, Take 50 mg by mouth daily., Disp: , Rfl:  .  loratadine  (CLARITIN) 10 MG tablet, Take 10 mg by mouth daily as needed for allergies., Disp: , Rfl:  .  lovastatin (MEVACOR) 40 MG tablet, Take 40 mg by mouth at bedtime. , Disp: , Rfl:  .  Multiple Vitamins-Minerals (EMERGEN-C IMMUNE PO), Take by mouth as needed., Disp: , Rfl:  .  sildenafil (VIAGRA) 100 MG tablet, Take 100 mg by mouth as needed for erectile dysfunction., Disp: , Rfl:  .  fluticasone (FLONASE) 50 MCG/ACT nasal spray, Place into both nostrils as needed for allergies or rhinitis. (Patient not taking: Reported on 06/20/2020), Disp: , Rfl:  .  lisinopril (PRINIVIL,ZESTRIL) 20 MG tablet, Take 20 mg by mouth daily. (Patient not taking: Reported on 06/20/2020), Disp: , Rfl:  .  metFORMIN (GLUCOPHAGE) 1000 MG tablet, Take 1,000 mg by mouth 2 (two) times daily with a meal. (Patient not taking: Reported on 06/20/2020), Disp: , Rfl:  .  oxyCODONE-acetaminophen (ROXICET) 5-325 MG tablet, Take 1-2 tablets by mouth every 6 (six) hours as needed for severe pain. (Patient not taking: Reported on 12/22/2018), Disp: 12 tablet, Rfl: 0 .  TRULICITY 0.35 WS/5.6CL SOPN, , Disp: , Rfl:  Medication Side Effects: none  Family Medical/ Social History: Changes? No  MENTAL HEALTH EXAM:  There were no vitals taken for this visit.There is no height or weight on file to calculate BMI.  General Appearance: unable to assess  Eye Contact:  unable to assess  Speech:  Clear and Coherent and Normal Rate  Volume:  Normal  Mood:  Euthymic  Affect:  unable to assess  Thought Process:  Goal Directed and Descriptions of Associations: Intact  Orientation:  Full (Time, Place, and Person)  Thought Content: Logical   Suicidal Thoughts:  No  Homicidal Thoughts:  No  Memory:  WNL  Judgement:  Good  Insight:  Good  Psychomotor Activity:  unable to assess  Concentration:  Concentration: Good  Recall:  Good  Fund of Knowledge: Good  Language: Good  Assets:  Desire for Improvement  ADL's:  Intact  Cognition: WNL   Prognosis:  Good    DIAGNOSES:    ICD-10-CM   1. Recurrent major depressive disorder, in partial remission ([place])  F33.41   2. Insomnia, unspecified type  G47.00   3. Grief  F43.21     Receiving Psychotherapy: No    RECOMMENDATIONS:  PDMP was reviewed. I provided 20 minutes of nonface-to-face time during this encounter. Continue Xanax 0.5 mg, 1-2 nightly as needed for anxiety or sleep.  Continue Wellbutrin SR 200 mg, 1 p.o. twice daily.  His PCP has prescribed it most recently.  I will prescribe if he needs me to. Return in 6 months  Donnal Moat, [place]

## 2020-06-28 NOTE — Telephone Encounter (Signed)
[name], Blake Young are scheduled for a virtual visit with your provider today.    Just as we do with appointments in the office, we must obtain your consent to participate.  Your consent will be active for this visit and any virtual visit you may have with one of our providers in the next 365 days.    If you have a MyChart account, I can also send a copy of this consent to you electronically.  All virtual visits are billed to your insurance company just like a traditional visit in the office.  As this is a virtual visit, video technology does not allow for your provider to perform a traditional examination.  This may limit your provider's ability to fully assess your condition.  If your provider identifies any concerns that need to be evaluated in person or the need to arrange testing such as labs, EKG, etc, we will make arrangements to do so.    Although advances in technology are sophisticated, we cannot ensure that it will always work on either your end or our end.  If the connection with a video visit is poor, we may have to switch to a telephone visit.  With either a video or telephone visit, we are not always able to ensure that we have a secure connection.   I need to obtain your verbal consent now.   Are you willing to proceed with your visit today?   Blake Young. has provided verbal consent on 06/28/2020 for a virtual visit (video or telephone).   Donnal Moat, PA-C 06/28/2020  1:38 PM

## 2020-07-05 ENCOUNTER — Other Ambulatory Visit: Payer: Self-pay

## 2020-07-05 NOTE — Patient Outreach (Signed)
  [facility]) Care Management Chronic Special Needs Program    07/05/2020  Name: Blake Young., DOB: 1950-10-11  MRN: [id number]   [facility] Management will continue to provide services for this member through 09/02/2020. The [facility] Care Management Team will assume care 09/03/2020.  Thea Silversmith, RN, MSN, [place] [phone]

## 2020-07-25 DIAGNOSIS — N400 Enlarged prostate without lower urinary tract symptoms: Secondary | ICD-10-CM | POA: Diagnosis not present

## 2020-07-25 DIAGNOSIS — F322 Major depressive disorder, single episode, severe without psychotic features: Secondary | ICD-10-CM | POA: Diagnosis not present

## 2020-07-25 DIAGNOSIS — K219 Gastro-esophageal reflux disease without esophagitis: Secondary | ICD-10-CM | POA: Diagnosis not present

## 2020-07-25 DIAGNOSIS — I10 Essential (primary) hypertension: Secondary | ICD-10-CM | POA: Diagnosis not present

## 2020-07-25 DIAGNOSIS — E1122 Type 2 diabetes mellitus with diabetic chronic kidney disease: Secondary | ICD-10-CM | POA: Diagnosis not present

## 2020-07-25 DIAGNOSIS — G4700 Insomnia, unspecified: Secondary | ICD-10-CM | POA: Diagnosis not present

## 2020-07-25 DIAGNOSIS — E7800 Pure hypercholesterolemia, unspecified: Secondary | ICD-10-CM | POA: Diagnosis not present

## 2020-07-25 DIAGNOSIS — M1712 Unilateral primary osteoarthritis, left knee: Secondary | ICD-10-CM | POA: Diagnosis not present

## 2020-07-25 DIAGNOSIS — N183 Chronic kidney disease, stage 3 unspecified: Secondary | ICD-10-CM | POA: Diagnosis not present

## 2020-08-12 DIAGNOSIS — R350 Frequency of micturition: Secondary | ICD-10-CM | POA: Diagnosis not present

## 2020-08-30 DIAGNOSIS — K219 Gastro-esophageal reflux disease without esophagitis: Secondary | ICD-10-CM | POA: Diagnosis not present

## 2020-08-30 DIAGNOSIS — F322 Major depressive disorder, single episode, severe without psychotic features: Secondary | ICD-10-CM | POA: Diagnosis not present

## 2020-08-30 DIAGNOSIS — N400 Enlarged prostate without lower urinary tract symptoms: Secondary | ICD-10-CM | POA: Diagnosis not present

## 2020-08-30 DIAGNOSIS — N183 Chronic kidney disease, stage 3 unspecified: Secondary | ICD-10-CM | POA: Diagnosis not present

## 2020-08-30 DIAGNOSIS — G4700 Insomnia, unspecified: Secondary | ICD-10-CM | POA: Diagnosis not present

## 2020-08-30 DIAGNOSIS — I10 Essential (primary) hypertension: Secondary | ICD-10-CM | POA: Diagnosis not present

## 2020-08-30 DIAGNOSIS — E1122 Type 2 diabetes mellitus with diabetic chronic kidney disease: Secondary | ICD-10-CM | POA: Diagnosis not present

## 2020-08-30 DIAGNOSIS — E7800 Pure hypercholesterolemia, unspecified: Secondary | ICD-10-CM | POA: Diagnosis not present

## 2020-08-30 DIAGNOSIS — M1712 Unilateral primary osteoarthritis, left knee: Secondary | ICD-10-CM | POA: Diagnosis not present

## 2020-09-07 ENCOUNTER — Other Ambulatory Visit: Payer: Self-pay

## 2020-09-28 DIAGNOSIS — E7800 Pure hypercholesterolemia, unspecified: Secondary | ICD-10-CM | POA: Diagnosis not present

## 2020-09-28 DIAGNOSIS — I10 Essential (primary) hypertension: Secondary | ICD-10-CM | POA: Diagnosis not present

## 2020-09-28 DIAGNOSIS — N183 Chronic kidney disease, stage 3 unspecified: Secondary | ICD-10-CM | POA: Diagnosis not present

## 2020-09-28 DIAGNOSIS — E1122 Type 2 diabetes mellitus with diabetic chronic kidney disease: Secondary | ICD-10-CM | POA: Diagnosis not present

## 2020-09-28 DIAGNOSIS — N200 Calculus of kidney: Secondary | ICD-10-CM | POA: Diagnosis not present

## 2020-09-28 DIAGNOSIS — G4733 Obstructive sleep apnea (adult) (pediatric): Secondary | ICD-10-CM | POA: Diagnosis not present

## 2020-09-28 DIAGNOSIS — Z7984 Long term (current) use of oral hypoglycemic drugs: Secondary | ICD-10-CM | POA: Diagnosis not present

## 2020-09-28 DIAGNOSIS — F330 Major depressive disorder, recurrent, mild: Secondary | ICD-10-CM | POA: Diagnosis not present

## 2020-09-28 DIAGNOSIS — G4700 Insomnia, unspecified: Secondary | ICD-10-CM | POA: Diagnosis not present

## 2020-09-29 DIAGNOSIS — E1122 Type 2 diabetes mellitus with diabetic chronic kidney disease: Secondary | ICD-10-CM | POA: Diagnosis not present

## 2020-09-29 DIAGNOSIS — G4700 Insomnia, unspecified: Secondary | ICD-10-CM | POA: Diagnosis not present

## 2020-09-29 DIAGNOSIS — I10 Essential (primary) hypertension: Secondary | ICD-10-CM | POA: Diagnosis not present

## 2020-09-29 DIAGNOSIS — K219 Gastro-esophageal reflux disease without esophagitis: Secondary | ICD-10-CM | POA: Diagnosis not present

## 2020-09-29 DIAGNOSIS — N183 Chronic kidney disease, stage 3 unspecified: Secondary | ICD-10-CM | POA: Diagnosis not present

## 2020-09-29 DIAGNOSIS — M1712 Unilateral primary osteoarthritis, left knee: Secondary | ICD-10-CM | POA: Diagnosis not present

## 2020-09-29 DIAGNOSIS — N400 Enlarged prostate without lower urinary tract symptoms: Secondary | ICD-10-CM | POA: Diagnosis not present

## 2020-09-29 DIAGNOSIS — E7800 Pure hypercholesterolemia, unspecified: Secondary | ICD-10-CM | POA: Diagnosis not present

## 2020-09-29 DIAGNOSIS — F322 Major depressive disorder, single episode, severe without psychotic features: Secondary | ICD-10-CM | POA: Diagnosis not present

## 2020-09-29 DIAGNOSIS — F330 Major depressive disorder, recurrent, mild: Secondary | ICD-10-CM | POA: Diagnosis not present

## 2020-10-31 DIAGNOSIS — F330 Major depressive disorder, recurrent, mild: Secondary | ICD-10-CM | POA: Diagnosis not present

## 2020-10-31 DIAGNOSIS — G4700 Insomnia, unspecified: Secondary | ICD-10-CM | POA: Diagnosis not present

## 2020-10-31 DIAGNOSIS — E1122 Type 2 diabetes mellitus with diabetic chronic kidney disease: Secondary | ICD-10-CM | POA: Diagnosis not present

## 2020-10-31 DIAGNOSIS — N400 Enlarged prostate without lower urinary tract symptoms: Secondary | ICD-10-CM | POA: Diagnosis not present

## 2020-10-31 DIAGNOSIS — M1712 Unilateral primary osteoarthritis, left knee: Secondary | ICD-10-CM | POA: Diagnosis not present

## 2020-10-31 DIAGNOSIS — N183 Chronic kidney disease, stage 3 unspecified: Secondary | ICD-10-CM | POA: Diagnosis not present

## 2020-10-31 DIAGNOSIS — I10 Essential (primary) hypertension: Secondary | ICD-10-CM | POA: Diagnosis not present

## 2020-10-31 DIAGNOSIS — K219 Gastro-esophageal reflux disease without esophagitis: Secondary | ICD-10-CM | POA: Diagnosis not present

## 2020-10-31 DIAGNOSIS — F322 Major depressive disorder, single episode, severe without psychotic features: Secondary | ICD-10-CM | POA: Diagnosis not present

## 2020-10-31 DIAGNOSIS — E7800 Pure hypercholesterolemia, unspecified: Secondary | ICD-10-CM | POA: Diagnosis not present

## 2020-11-09 DIAGNOSIS — E1122 Type 2 diabetes mellitus with diabetic chronic kidney disease: Secondary | ICD-10-CM | POA: Diagnosis not present

## 2020-11-09 DIAGNOSIS — E7800 Pure hypercholesterolemia, unspecified: Secondary | ICD-10-CM | POA: Diagnosis not present

## 2020-11-09 DIAGNOSIS — I10 Essential (primary) hypertension: Secondary | ICD-10-CM | POA: Diagnosis not present

## 2020-11-09 DIAGNOSIS — K219 Gastro-esophageal reflux disease without esophagitis: Secondary | ICD-10-CM | POA: Diagnosis not present

## 2020-11-09 DIAGNOSIS — N183 Chronic kidney disease, stage 3 unspecified: Secondary | ICD-10-CM | POA: Diagnosis not present

## 2020-11-09 DIAGNOSIS — M1712 Unilateral primary osteoarthritis, left knee: Secondary | ICD-10-CM | POA: Diagnosis not present

## 2020-11-09 DIAGNOSIS — G4700 Insomnia, unspecified: Secondary | ICD-10-CM | POA: Diagnosis not present

## 2020-11-09 DIAGNOSIS — N400 Enlarged prostate without lower urinary tract symptoms: Secondary | ICD-10-CM | POA: Diagnosis not present

## 2020-12-26 DIAGNOSIS — F330 Major depressive disorder, recurrent, mild: Secondary | ICD-10-CM | POA: Diagnosis not present

## 2020-12-26 DIAGNOSIS — M1712 Unilateral primary osteoarthritis, left knee: Secondary | ICD-10-CM | POA: Diagnosis not present

## 2020-12-26 DIAGNOSIS — N183 Chronic kidney disease, stage 3 unspecified: Secondary | ICD-10-CM | POA: Diagnosis not present

## 2020-12-26 DIAGNOSIS — N400 Enlarged prostate without lower urinary tract symptoms: Secondary | ICD-10-CM | POA: Diagnosis not present

## 2020-12-26 DIAGNOSIS — E7800 Pure hypercholesterolemia, unspecified: Secondary | ICD-10-CM | POA: Diagnosis not present

## 2020-12-26 DIAGNOSIS — E1122 Type 2 diabetes mellitus with diabetic chronic kidney disease: Secondary | ICD-10-CM | POA: Diagnosis not present

## 2020-12-26 DIAGNOSIS — K219 Gastro-esophageal reflux disease without esophagitis: Secondary | ICD-10-CM | POA: Diagnosis not present

## 2020-12-26 DIAGNOSIS — I10 Essential (primary) hypertension: Secondary | ICD-10-CM | POA: Diagnosis not present

## 2020-12-26 DIAGNOSIS — G4700 Insomnia, unspecified: Secondary | ICD-10-CM | POA: Diagnosis not present

## 2020-12-27 ENCOUNTER — Ambulatory Visit (INDEPENDENT_AMBULATORY_CARE_PROVIDER_SITE_OTHER): Payer: HMO | Admitting: Physician Assistant

## 2020-12-27 ENCOUNTER — Encounter: Payer: Self-pay | Admitting: Physician Assistant

## 2020-12-27 ENCOUNTER — Other Ambulatory Visit: Payer: Self-pay

## 2020-12-27 DIAGNOSIS — F411 Generalized anxiety disorder: Secondary | ICD-10-CM

## 2020-12-27 DIAGNOSIS — G4700 Insomnia, unspecified: Secondary | ICD-10-CM | POA: Diagnosis not present

## 2020-12-27 DIAGNOSIS — F3341 Major depressive disorder, recurrent, in partial remission: Secondary | ICD-10-CM | POA: Diagnosis not present

## 2020-12-27 MED ORDER — LORAZEPAM 1 MG PO TABS: 1.0000 mg | ORAL_TABLET | Freq: Two times a day (BID) | ORAL | 0 refills | Status: DC | PRN

## 2020-12-27 NOTE — Progress Notes (Signed)
[facility]  Patient ID: Blake Young.,  MRN: [id number]  PCP: Wenda Low, MD  Date of Evaluation: 12/27/2020 Time spent:30 minutes  Chief Complaint:  Chief Complaint    Anxiety; Depression; Follow-up      HISTORY/CURRENT STATUS: HPI For 6 month med check.  Wonders if ok to change to Ativan. Cheaper than the Xanax. He pretty much only uses it to help him relax to go to sleep. Not having anxiety during the day very often.  Having racing thoughts for a few hours, starting even before his feet hit the floor.  He has never taken Xanax for anxiety, only for sleep.  He's going through a lot right now. His dad ended up in hospital for a month last fall, d/t covid. He then got A Fib and unable to walk. Ended up in nursing home. Had a very difficult time finding a place for him to go. Now in [place], his Dad is demanding now, has dementia. Pt had been going every day for 5 to 6 hours a day to visit with him but that was really taking a toll on him.  Now he is going a couple of times a week but then feels guilty for not being there more.  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  He accepted it and now wonders "what was I thinking?"  States he already had more than he could handle but this has added even more stress.  He feels like the Wellbutrin has helped, but again tells me the only way he knows that it has helped is that when he goes off of it he is extremely depressed.  At least now he has enough energy and motivation to do the things that he has to do.  Does not really have time to enjoy much of anything.  Not isolating.  No suicidal or homicidal thoughts.  Patient denies increased energy with decreased need for sleep, no increased talkativeness, no racing thoughts, no impulsivity or risky behaviors, no increased spending, no increased libido, no grandiosity, no  increased irritability or anger, and no hallucinations.  Denies dizziness, syncope, seizures, numbness, tingling, tremor, tics, unsteady gait, slurred speech, confusion. Denies muscle or joint pain, stiffness, or dystonia.  Individual Medical History/ Review of Systems: Changes? :No   Past medications for mental health diagnoses include: Cymbalta, Wellbutrin, Deplin, Elavil, Pamelor, Serzone, Nardil, Depakote, Prozac, Zoloft, Celexa, trazodone, Effexor, lithium, Abilify, Lamictal, mirtazapine, Lunesta, Restoril, Sonata, Xanax.  Allergies: Flomax [tamsulosin hcl], Ozempic (0.25 or 0.5 mg-dose) [semaglutide(0.25 or 0.5mg -dos)], Trulicity [dulaglutide], and Victoza [liraglutide]  Current Medications:  Current Outpatient Medications:  .  acetaminophen (TYLENOL) 500 MG tablet, Take 500 mg by mouth., Disp: , Rfl:  .  amLODipine (NORVASC) 2.5 MG tablet, Take 2.5 mg by mouth daily., Disp: , Rfl:  .  aspirin EC 81 MG tablet, Take 81 mg by mouth daily., Disp: , Rfl:  .  bismuth subsalicylate (PEPTO BISMOL) 262 MG chewable tablet, Chew 524 mg by mouth as needed., Disp: , Rfl:  .  buPROPion (WELLBUTRIN SR) 200 MG 12 hr tablet, Take 200 mg by mouth 2 (two) times daily., Disp: , Rfl:  .  famotidine (PEPCID) 20 MG tablet, 20 mg daily. , Disp: , Rfl:  .  gabapentin (NEURONTIN) 300 MG capsule, Take 300 mg by mouth daily., Disp: , Rfl:  .  glipiZIDE (GLUCOTROL XL) 2.5 MG 24 hr  tablet, TAKE 1 TABLET BY MOUTH ONCE DAILY IN THE MORNING WITH BREAKFAST, Disp: , Rfl:  .  hydrocortisone 2.5 % cream, Apply topically as needed., Disp: , Rfl:  .  loratadine (CLARITIN) 10 MG tablet, Take 10 mg by mouth daily as needed for allergies., Disp: , Rfl:  .  LORazepam (ATIVAN) 1 MG tablet, Take 1 tablet (1 mg total) by mouth 2 (two) times daily as needed for anxiety., Disp: 60 tablet, Rfl: 0 .  lovastatin (MEVACOR) 40 MG tablet, Take 40 mg by mouth at bedtime. , Disp: , Rfl:  .  Multiple Vitamins-Minerals (EMERGEN-C IMMUNE PO),  Take by mouth as needed., Disp: , Rfl:  .  sildenafil (VIAGRA) 100 MG tablet, Take 100 mg by mouth as needed for erectile dysfunction., Disp: , Rfl:  .  fluticasone (FLONASE) 50 MCG/ACT nasal spray, Place into both nostrils as needed for allergies or rhinitis. (Patient not taking: No sig reported), Disp: , Rfl:  .  JANUVIA 50 MG tablet, Take 50 mg by mouth daily. (Patient not taking: Reported on 12/27/2020), Disp: , Rfl:  .  lisinopril (PRINIVIL,ZESTRIL) 20 MG tablet, Take 20 mg by mouth daily. (Patient not taking: No sig reported), Disp: , Rfl:  .  metFORMIN (GLUCOPHAGE) 1000 MG tablet, Take 1,000 mg by mouth 2 (two) times daily with a meal. (Patient not taking: No sig reported), Disp: , Rfl:  .  oxyCODONE-acetaminophen (ROXICET) 5-325 MG tablet, Take 1-2 tablets by mouth every 6 (six) hours as needed for severe pain. (Patient not taking: No sig reported), Disp: 12 tablet, Rfl: 0 .  TRESIBA FLEXTOUCH 100 UNIT/ML FlexTouch Pen, SMARTSIG:10 Unit(s) SUB-Q Daily, Disp: , Rfl:  .  TRULICITY 7.62 GB/1.5VV SOPN, , Disp: , Rfl:  Medication Side Effects: none  Family Medical/ Social History: Changes? Yes, see HPI  MENTAL HEALTH EXAM:  There were no vitals taken for this visit.There is no height or weight on file to calculate BMI.  General Appearance: Casual and Well Groomed  Eye Contact:  Good  Speech:  Clear and Coherent and Normal Rate  Volume:  Normal  Mood:  Anxious  Affect:  Anxious  Thought Process:  Goal Directed and Descriptions of Associations: Circumstantial  Orientation:  Full (Time, Place, and Person)  Thought Content: Logical   Suicidal Thoughts:  No  Homicidal Thoughts:  No  Memory:  WNL  Judgement:  Good  Insight:  Good  Psychomotor Activity:  Normal  Concentration:  Concentration: Good  Recall:  Good  Fund of Knowledge: Good  Language: Good  Assets:  Desire for Improvement  ADL's:  Intact  Cognition: WNL  Prognosis:  Good    DIAGNOSES:    ICD-10-CM   1. Generalized  anxiety disorder  F41.1   2. Recurrent major depressive disorder, in partial remission ([place])  F33.41   3. Insomnia, unspecified type  G47.00     Receiving Psychotherapy: No    RECOMMENDATIONS:  PDMP was reviewed. I provided 30 minutes of face-to-face time during this encounter, including time spent before and after the visit and records review and charting. It is fine to change Xanax to Ativan.  Explained that Ativan is less potent then Xanax so I will basically double the dose but will be equivalent to the Xanax.  In some people it does not cause drowsiness so we will have to just see how he does.  I recommend that he take 1/2-1 Ativan as soon as he gets up in the morning to help with the racing  thoughts.  Benefits and risks, side effects were discussed and he accepts.  If this is working really well, he should call and request refills.  If that is the case he does not have to be seen in 2 months but can go back to 47-month visits. Discontinue Xanax. Start Ativan 1 mg, 1/2-1 p.o. twice daily as needed. Continue Wellbutrin SR 200 mg, 1 p.o. twice daily.  His PCP prescribes this. Continue gabapentin 300 mg daily.  Per another provider. Strongly recommend counseling.  Recommend Carney Bern, Evelena Peat [place], or call [facility]. Return in 2 months.  Donnal Moat, PA-C

## 2021-01-13 DIAGNOSIS — I129 Hypertensive chronic kidney disease with stage 1 through stage 4 chronic kidney disease, or unspecified chronic kidney disease: Secondary | ICD-10-CM | POA: Diagnosis not present

## 2021-01-13 DIAGNOSIS — R5383 Other fatigue: Secondary | ICD-10-CM | POA: Diagnosis not present

## 2021-01-13 DIAGNOSIS — F331 Major depressive disorder, recurrent, moderate: Secondary | ICD-10-CM | POA: Diagnosis not present

## 2021-01-13 DIAGNOSIS — N1832 Chronic kidney disease, stage 3b: Secondary | ICD-10-CM | POA: Diagnosis not present

## 2021-01-13 DIAGNOSIS — Z79899 Other long term (current) drug therapy: Secondary | ICD-10-CM | POA: Diagnosis not present

## 2021-01-13 DIAGNOSIS — E1122 Type 2 diabetes mellitus with diabetic chronic kidney disease: Secondary | ICD-10-CM | POA: Diagnosis not present

## 2021-01-27 ENCOUNTER — Other Ambulatory Visit: Payer: Self-pay | Admitting: Physician Assistant

## 2021-01-27 MED ORDER — LORAZEPAM 1 MG PO TABS: ORAL_TABLET | ORAL | 1 refills | Status: DC

## 2021-01-27 NOTE — Telephone Encounter (Signed)
Last filled 01/02/21

## 2021-01-31 DIAGNOSIS — N183 Chronic kidney disease, stage 3 unspecified: Secondary | ICD-10-CM | POA: Diagnosis not present

## 2021-01-31 DIAGNOSIS — E7800 Pure hypercholesterolemia, unspecified: Secondary | ICD-10-CM | POA: Diagnosis not present

## 2021-01-31 DIAGNOSIS — K219 Gastro-esophageal reflux disease without esophagitis: Secondary | ICD-10-CM | POA: Diagnosis not present

## 2021-01-31 DIAGNOSIS — E1122 Type 2 diabetes mellitus with diabetic chronic kidney disease: Secondary | ICD-10-CM | POA: Diagnosis not present

## 2021-01-31 DIAGNOSIS — N400 Enlarged prostate without lower urinary tract symptoms: Secondary | ICD-10-CM | POA: Diagnosis not present

## 2021-01-31 DIAGNOSIS — I10 Essential (primary) hypertension: Secondary | ICD-10-CM | POA: Diagnosis not present

## 2021-01-31 DIAGNOSIS — G4700 Insomnia, unspecified: Secondary | ICD-10-CM | POA: Diagnosis not present

## 2021-02-21 DIAGNOSIS — K219 Gastro-esophageal reflux disease without esophagitis: Secondary | ICD-10-CM | POA: Diagnosis not present

## 2021-02-21 DIAGNOSIS — E1122 Type 2 diabetes mellitus with diabetic chronic kidney disease: Secondary | ICD-10-CM | POA: Diagnosis not present

## 2021-02-21 DIAGNOSIS — N1832 Chronic kidney disease, stage 3b: Secondary | ICD-10-CM | POA: Diagnosis not present

## 2021-02-21 DIAGNOSIS — I129 Hypertensive chronic kidney disease with stage 1 through stage 4 chronic kidney disease, or unspecified chronic kidney disease: Secondary | ICD-10-CM | POA: Diagnosis not present

## 2021-02-27 DIAGNOSIS — L820 Inflamed seborrheic keratosis: Secondary | ICD-10-CM | POA: Diagnosis not present

## 2021-02-27 DIAGNOSIS — Z85828 Personal history of other malignant neoplasm of skin: Secondary | ICD-10-CM | POA: Diagnosis not present

## 2021-02-27 DIAGNOSIS — L578 Other skin changes due to chronic exposure to nonionizing radiation: Secondary | ICD-10-CM | POA: Diagnosis not present

## 2021-02-27 DIAGNOSIS — L814 Other melanin hyperpigmentation: Secondary | ICD-10-CM | POA: Diagnosis not present

## 2021-02-27 DIAGNOSIS — D485 Neoplasm of uncertain behavior of skin: Secondary | ICD-10-CM | POA: Diagnosis not present

## 2021-02-27 DIAGNOSIS — B079 Viral wart, unspecified: Secondary | ICD-10-CM | POA: Diagnosis not present

## 2021-02-27 DIAGNOSIS — D1801 Hemangioma of skin and subcutaneous tissue: Secondary | ICD-10-CM | POA: Diagnosis not present

## 2021-02-27 DIAGNOSIS — L570 Actinic keratosis: Secondary | ICD-10-CM | POA: Diagnosis not present

## 2021-02-27 DIAGNOSIS — L905 Scar conditions and fibrosis of skin: Secondary | ICD-10-CM | POA: Diagnosis not present

## 2021-02-27 DIAGNOSIS — D225 Melanocytic nevi of trunk: Secondary | ICD-10-CM | POA: Diagnosis not present

## 2021-02-27 DIAGNOSIS — L821 Other seborrheic keratosis: Secondary | ICD-10-CM | POA: Diagnosis not present

## 2021-03-02 DIAGNOSIS — K219 Gastro-esophageal reflux disease without esophagitis: Secondary | ICD-10-CM | POA: Diagnosis not present

## 2021-03-02 DIAGNOSIS — I10 Essential (primary) hypertension: Secondary | ICD-10-CM | POA: Diagnosis not present

## 2021-03-02 DIAGNOSIS — G4700 Insomnia, unspecified: Secondary | ICD-10-CM | POA: Diagnosis not present

## 2021-03-02 DIAGNOSIS — E1122 Type 2 diabetes mellitus with diabetic chronic kidney disease: Secondary | ICD-10-CM | POA: Diagnosis not present

## 2021-03-02 DIAGNOSIS — F322 Major depressive disorder, single episode, severe without psychotic features: Secondary | ICD-10-CM | POA: Diagnosis not present

## 2021-03-02 DIAGNOSIS — N183 Chronic kidney disease, stage 3 unspecified: Secondary | ICD-10-CM | POA: Diagnosis not present

## 2021-03-02 DIAGNOSIS — N400 Enlarged prostate without lower urinary tract symptoms: Secondary | ICD-10-CM | POA: Diagnosis not present

## 2021-03-02 DIAGNOSIS — E7800 Pure hypercholesterolemia, unspecified: Secondary | ICD-10-CM | POA: Diagnosis not present

## 2021-03-09 ENCOUNTER — Telehealth: Payer: HMO | Admitting: Physician Assistant

## 2021-03-23 DIAGNOSIS — N1832 Chronic kidney disease, stage 3b: Secondary | ICD-10-CM | POA: Diagnosis not present

## 2021-03-23 DIAGNOSIS — E7800 Pure hypercholesterolemia, unspecified: Secondary | ICD-10-CM | POA: Diagnosis not present

## 2021-03-23 DIAGNOSIS — N183 Chronic kidney disease, stage 3 unspecified: Secondary | ICD-10-CM | POA: Diagnosis not present

## 2021-03-23 DIAGNOSIS — N400 Enlarged prostate without lower urinary tract symptoms: Secondary | ICD-10-CM | POA: Diagnosis not present

## 2021-03-23 DIAGNOSIS — I129 Hypertensive chronic kidney disease with stage 1 through stage 4 chronic kidney disease, or unspecified chronic kidney disease: Secondary | ICD-10-CM | POA: Diagnosis not present

## 2021-03-23 DIAGNOSIS — K219 Gastro-esophageal reflux disease without esophagitis: Secondary | ICD-10-CM | POA: Diagnosis not present

## 2021-03-23 DIAGNOSIS — G4700 Insomnia, unspecified: Secondary | ICD-10-CM | POA: Diagnosis not present

## 2021-03-23 DIAGNOSIS — E1122 Type 2 diabetes mellitus with diabetic chronic kidney disease: Secondary | ICD-10-CM | POA: Diagnosis not present

## 2021-03-23 DIAGNOSIS — F331 Major depressive disorder, recurrent, moderate: Secondary | ICD-10-CM | POA: Diagnosis not present

## 2021-03-23 DIAGNOSIS — M1712 Unilateral primary osteoarthritis, left knee: Secondary | ICD-10-CM | POA: Diagnosis not present

## 2021-03-23 DIAGNOSIS — I10 Essential (primary) hypertension: Secondary | ICD-10-CM | POA: Diagnosis not present

## 2021-04-06 ENCOUNTER — Telehealth (INDEPENDENT_AMBULATORY_CARE_PROVIDER_SITE_OTHER): Payer: HMO | Admitting: Physician Assistant

## 2021-04-06 ENCOUNTER — Encounter: Payer: Self-pay | Admitting: Physician Assistant

## 2021-04-06 DIAGNOSIS — G4700 Insomnia, unspecified: Secondary | ICD-10-CM | POA: Diagnosis not present

## 2021-04-06 DIAGNOSIS — F3341 Major depressive disorder, recurrent, in partial remission: Secondary | ICD-10-CM | POA: Diagnosis not present

## 2021-04-06 DIAGNOSIS — F4321 Adjustment disorder with depressed mood: Secondary | ICD-10-CM | POA: Diagnosis not present

## 2021-04-06 DIAGNOSIS — F411 Generalized anxiety disorder: Secondary | ICD-10-CM | POA: Diagnosis not present

## 2021-04-06 MED ORDER — ALPRAZOLAM 0.5 MG PO TABS: 0.5000 mg | ORAL_TABLET | Freq: Every evening | ORAL | 5 refills | Status: DC | PRN

## 2021-04-06 NOTE — Progress Notes (Signed)
[facility]  Patient ID: Blake Young.,  MRN: [id number]  PCP: Wenda Low, MD  Date of Evaluation: 04/06/2021 Time spent:25 minutes  Chief Complaint:  Chief Complaint   Anxiety; Depression; Follow-up    Virtual Visit via Telehealth  I connected with patient by telephone, with their informed consent, and verified patient privacy and that I am speaking with the correct person using two identifiers.  I am private, in my office and the patient is at home.  I discussed the limitations, risks, security and privacy concerns of performing an evaluation and management service by telephone and the availability of in person appointments. I also discussed with the patient that there may be a patient responsible charge related to this service. The patient expressed understanding and agreed to proceed.   I discussed the assessment and treatment plan with the patient. The patient was provided an opportunity to ask questions and all were answered. The patient agreed with the plan and demonstrated an understanding of the instructions.   The patient was advised to call back or seek an in-person evaluation if the symptoms worsen or if the condition fails to improve as anticipated.  I provided 25 minutes of non-face-to-face time during this encounter.   HISTORY/CURRENT STATUS: HPI For 6 month med check.  His dad died in March 15, 2023. His Mom had died last fall.  Of course he is sad but they were older and they are back together again so he is glad of that.  Daughter is in [place] in [place] with GB, liver, and pancreas failure. Has had GB out.  It's touch and go right now. He has a lot of faith, but is wondering why things like this happen.  Very stressed with all these things happening.  The Ativan that we switch to at the last visit has not been as effective as the Xanax to help him relax to go to sleep.  If possible he would like to change back to Xanax.  He hardly ever takes either drug in  the daytime, but only before going to sleep so he will not have obsessive thoughts.  He still feels like the Wellbutrin has been the best antidepressant he has been on and does not want to go off of it.  Because of these current family issues, he has not had time to enjoy much of anything.  Being on the Wellbutrin has given him a little more energy and motivation though.  Not isolating.  No suicidal or homicidal thoughts.  Patient denies increased energy with decreased need for sleep, no increased talkativeness, no racing thoughts, no impulsivity or risky behaviors, no increased spending, no increased libido, no grandiosity, no increased irritability or anger, and no hallucinations.  Denies dizziness, syncope, seizures, numbness, tingling, tremor, tics, unsteady gait, slurred speech, confusion. Denies muscle or joint pain, stiffness, or dystonia.  Individual Medical History/ Review of Systems: Changes? :No   Past medications for mental health diagnoses include: Cymbalta, Wellbutrin, Deplin, Elavil, Pamelor, Serzone, Nardil, Depakote, Prozac, Zoloft, Celexa, trazodone, Effexor, lithium, Abilify, Lamictal, mirtazapine, Lunesta, Restoril, Sonata, Xanax.  Allergies: Flomax [tamsulosin hcl], Ozempic (0.25 or 0.5 mg-dose) [semaglutide(0.25 or 0.'5mg'$ -dos)], Trulicity [dulaglutide], and Victoza [liraglutide]  Current Medications:  Current Outpatient Medications:    acetaminophen (TYLENOL) 500 MG tablet, Take 500 mg by mouth., Disp: , Rfl:    amLODipine (NORVASC) 2.5 MG tablet, Take 2.5 mg by mouth daily., Disp: , Rfl:    aspirin EC 81 MG tablet, Take 81 mg by mouth daily., Disp: ,  Rfl:    bismuth subsalicylate (PEPTO BISMOL) 262 MG chewable tablet, Chew 524 mg by mouth as needed., Disp: , Rfl:    buPROPion (WELLBUTRIN SR) 200 MG 12 hr tablet, Take 200 mg by mouth 2 (two) times daily., Disp: , Rfl:    famotidine (PEPCID) 20 MG tablet, 20 mg daily. , Disp: , Rfl:    gabapentin (NEURONTIN) 300 MG capsule,  Take 300 mg by mouth daily., Disp: , Rfl:    glipiZIDE (GLUCOTROL XL) 2.5 MG 24 hr tablet, TAKE 1 TABLET BY MOUTH ONCE DAILY IN THE MORNING WITH BREAKFAST, Disp: , Rfl:    hydrocortisone 2.5 % cream, Apply topically as needed., Disp: , Rfl:    loratadine (CLARITIN) 10 MG tablet, Take 10 mg by mouth daily as needed for allergies., Disp: , Rfl:    lovastatin (MEVACOR) 40 MG tablet, Take 40 mg by mouth at bedtime. , Disp: , Rfl:    Multiple Vitamins-Minerals (EMERGEN-C IMMUNE PO), Take by mouth as needed., Disp: , Rfl:    RYBELSUS 7 MG TABS, Take 1 tablet by mouth every morning., Disp: , Rfl:    sildenafil (VIAGRA) 100 MG tablet, Take 100 mg by mouth as needed for erectile dysfunction., Disp: , Rfl:    TRESIBA FLEXTOUCH 100 UNIT/ML FlexTouch Pen, SMARTSIG:10 Unit(s) SUB-Q Daily, Disp: , Rfl:    ALPRAZolam (XANAX) 0.5 MG tablet, Take 1-2 tablets (0.5-1 mg total) by mouth at bedtime as needed for anxiety., Disp: 60 tablet, Rfl: 5   fluticasone (FLONASE) 50 MCG/ACT nasal spray, Place into both nostrils as needed for allergies or rhinitis. (Patient not taking: No sig reported), Disp: , Rfl:    JANUVIA 50 MG tablet, Take 50 mg by mouth daily. (Patient not taking: No sig reported), Disp: , Rfl:    lisinopril (PRINIVIL,ZESTRIL) 20 MG tablet, Take 20 mg by mouth daily. (Patient not taking: No sig reported), Disp: , Rfl:    metFORMIN (GLUCOPHAGE) 1000 MG tablet, Take 1,000 mg by mouth 2 (two) times daily with a meal. (Patient not taking: No sig reported), Disp: , Rfl:    oxyCODONE-acetaminophen (ROXICET) 5-325 MG tablet, Take 1-2 tablets by mouth every 6 (six) hours as needed for severe pain. (Patient not taking: No sig reported), Disp: 12 tablet, Rfl: 0   TRULICITY [id number] SOPN, , Disp: , Rfl:  Medication Side Effects: none  Family Medical/ Social History: Changes? Yes, see HPI  MENTAL HEALTH EXAM:  There were no vitals taken for this visit.There is no height or weight on file to calculate BMI.   General Appearance:  Unable to assess  Eye Contact:   Unable to assess  Speech:  Clear and Coherent and Normal Rate  Volume:  Normal  Mood:  Euthymic  Affect:   Unable to assess  Thought Process:  Goal Directed and Descriptions of Associations: Circumstantial  Orientation:  Full (Time, Place, and Person)  Thought Content: Logical   Suicidal Thoughts:  No  Homicidal Thoughts:  No  Memory:  WNL  Judgement:  Good  Insight:  Good  Psychomotor Activity:   Unable to assess  Concentration:  Concentration: Good  Recall:  Good  Fund of Knowledge: Good  Language: Good  Assets:  Desire for Improvement  ADL's:  Intact  Cognition: WNL  Prognosis:  Good    DIAGNOSES:    ICD-10-CM   1. Recurrent major depressive disorder, in partial remission ([place])  F33.41     2. Generalized anxiety disorder  F41.1     3.  Insomnia, unspecified type  G47.00     4. Grief  F43.21       Receiving Psychotherapy: No    RECOMMENDATIONS:  PDMP was reviewed. Last Ativan 01/27/2021. I provided 25 minutes of non-face-to-face time during this encounter, including time spent before and after the visit in records review, medical decision making, and charting.  My condolences for the loss of his dad.  And well wishes given for his daughter's illness. We discussed the Xanax versus Ativan.  The Xanax was more helpful for him so we will stop the Ativan and go back to the Xanax. Discontinue Ativan. Restart Xanax 0.5 mg, 1-2 nightly as needed sleep. Continue Wellbutrin SR 200 mg, 1 p.o. twice daily.  His PCP prescribes this. Continue gabapentin 300 mg daily.  Per another provider. Strongly recommend counseling.  Return in 6 months.  Donnal Moat, PA-C

## 2021-05-03 ENCOUNTER — Other Ambulatory Visit: Payer: Self-pay | Admitting: Physician Assistant

## 2021-05-03 ENCOUNTER — Telehealth: Payer: Self-pay | Admitting: Physician Assistant

## 2021-05-03 DIAGNOSIS — K219 Gastro-esophageal reflux disease without esophagitis: Secondary | ICD-10-CM | POA: Diagnosis not present

## 2021-05-03 DIAGNOSIS — I129 Hypertensive chronic kidney disease with stage 1 through stage 4 chronic kidney disease, or unspecified chronic kidney disease: Secondary | ICD-10-CM | POA: Diagnosis not present

## 2021-05-03 DIAGNOSIS — G4700 Insomnia, unspecified: Secondary | ICD-10-CM | POA: Diagnosis not present

## 2021-05-03 DIAGNOSIS — I10 Essential (primary) hypertension: Secondary | ICD-10-CM | POA: Diagnosis not present

## 2021-05-03 DIAGNOSIS — N1832 Chronic kidney disease, stage 3b: Secondary | ICD-10-CM | POA: Diagnosis not present

## 2021-05-03 DIAGNOSIS — M1712 Unilateral primary osteoarthritis, left knee: Secondary | ICD-10-CM | POA: Diagnosis not present

## 2021-05-03 DIAGNOSIS — E7800 Pure hypercholesterolemia, unspecified: Secondary | ICD-10-CM | POA: Diagnosis not present

## 2021-05-03 DIAGNOSIS — N400 Enlarged prostate without lower urinary tract symptoms: Secondary | ICD-10-CM | POA: Diagnosis not present

## 2021-05-03 DIAGNOSIS — F331 Major depressive disorder, recurrent, moderate: Secondary | ICD-10-CM | POA: Diagnosis not present

## 2021-05-03 DIAGNOSIS — E1122 Type 2 diabetes mellitus with diabetic chronic kidney disease: Secondary | ICD-10-CM | POA: Diagnosis not present

## 2021-05-03 DIAGNOSIS — N183 Chronic kidney disease, stage 3 unspecified: Secondary | ICD-10-CM | POA: Diagnosis not present

## 2021-05-03 MED ORDER — ALPRAZOLAM 0.5 MG PO TABS: 0.5000 mg | ORAL_TABLET | Freq: Every evening | ORAL | 5 refills | Status: DC | PRN

## 2021-05-03 NOTE — Telephone Encounter (Signed)
I think this got confused with alprazolam 0.5 mg, which he does take.  I sent in a prescription for that.

## 2021-05-03 NOTE — Telephone Encounter (Signed)
I don't see this on his med list

## 2021-05-03 NOTE — Telephone Encounter (Signed)
Samatha from [facility] stating that Blake Young requested them to give CR a call concerning refill for Adderall 0.'5mg'$ . States that he would like prescription to be delivered next week 9/6. They do not have a prescription on file and would like one sent over so that it can be filled.  Pls call if any question. [phone]. This is Samantha's direct line. Pharmacy [facility] [place], [place]

## 2021-05-25 DIAGNOSIS — K219 Gastro-esophageal reflux disease without esophagitis: Secondary | ICD-10-CM | POA: Diagnosis not present

## 2021-05-25 DIAGNOSIS — I129 Hypertensive chronic kidney disease with stage 1 through stage 4 chronic kidney disease, or unspecified chronic kidney disease: Secondary | ICD-10-CM | POA: Diagnosis not present

## 2021-05-25 DIAGNOSIS — F331 Major depressive disorder, recurrent, moderate: Secondary | ICD-10-CM | POA: Diagnosis not present

## 2021-05-25 DIAGNOSIS — G4700 Insomnia, unspecified: Secondary | ICD-10-CM | POA: Diagnosis not present

## 2021-05-25 DIAGNOSIS — N1832 Chronic kidney disease, stage 3b: Secondary | ICD-10-CM | POA: Diagnosis not present

## 2021-05-25 DIAGNOSIS — E7800 Pure hypercholesterolemia, unspecified: Secondary | ICD-10-CM | POA: Diagnosis not present

## 2021-05-25 DIAGNOSIS — E1122 Type 2 diabetes mellitus with diabetic chronic kidney disease: Secondary | ICD-10-CM | POA: Diagnosis not present

## 2021-05-31 DIAGNOSIS — G4733 Obstructive sleep apnea (adult) (pediatric): Secondary | ICD-10-CM | POA: Diagnosis not present

## 2021-05-31 DIAGNOSIS — K219 Gastro-esophageal reflux disease without esophagitis: Secondary | ICD-10-CM | POA: Diagnosis not present

## 2021-05-31 DIAGNOSIS — L989 Disorder of the skin and subcutaneous tissue, unspecified: Secondary | ICD-10-CM | POA: Diagnosis not present

## 2021-05-31 DIAGNOSIS — I129 Hypertensive chronic kidney disease with stage 1 through stage 4 chronic kidney disease, or unspecified chronic kidney disease: Secondary | ICD-10-CM | POA: Diagnosis not present

## 2021-05-31 DIAGNOSIS — Z0000 Encounter for general adult medical examination without abnormal findings: Secondary | ICD-10-CM | POA: Diagnosis not present

## 2021-05-31 DIAGNOSIS — E7800 Pure hypercholesterolemia, unspecified: Secondary | ICD-10-CM | POA: Diagnosis not present

## 2021-05-31 DIAGNOSIS — H6123 Impacted cerumen, bilateral: Secondary | ICD-10-CM | POA: Diagnosis not present

## 2021-05-31 DIAGNOSIS — E1122 Type 2 diabetes mellitus with diabetic chronic kidney disease: Secondary | ICD-10-CM | POA: Diagnosis not present

## 2021-05-31 DIAGNOSIS — Z23 Encounter for immunization: Secondary | ICD-10-CM | POA: Diagnosis not present

## 2021-05-31 DIAGNOSIS — N1832 Chronic kidney disease, stage 3b: Secondary | ICD-10-CM | POA: Diagnosis not present

## 2021-05-31 DIAGNOSIS — Z1159 Encounter for screening for other viral diseases: Secondary | ICD-10-CM | POA: Diagnosis not present

## 2021-05-31 DIAGNOSIS — Z79899 Other long term (current) drug therapy: Secondary | ICD-10-CM | POA: Diagnosis not present

## 2021-06-02 DIAGNOSIS — E119 Type 2 diabetes mellitus without complications: Secondary | ICD-10-CM | POA: Diagnosis not present

## 2021-06-02 DIAGNOSIS — H18593 Other hereditary corneal dystrophies, bilateral: Secondary | ICD-10-CM | POA: Diagnosis not present

## 2021-06-02 DIAGNOSIS — H40013 Open angle with borderline findings, low risk, bilateral: Secondary | ICD-10-CM | POA: Diagnosis not present

## 2021-06-02 DIAGNOSIS — H524 Presbyopia: Secondary | ICD-10-CM | POA: Diagnosis not present

## 2021-06-06 DIAGNOSIS — N183 Chronic kidney disease, stage 3 unspecified: Secondary | ICD-10-CM | POA: Diagnosis not present

## 2021-06-12 DIAGNOSIS — E1122 Type 2 diabetes mellitus with diabetic chronic kidney disease: Secondary | ICD-10-CM | POA: Diagnosis not present

## 2021-06-12 DIAGNOSIS — N183 Chronic kidney disease, stage 3 unspecified: Secondary | ICD-10-CM | POA: Diagnosis not present

## 2021-06-12 DIAGNOSIS — I129 Hypertensive chronic kidney disease with stage 1 through stage 4 chronic kidney disease, or unspecified chronic kidney disease: Secondary | ICD-10-CM | POA: Diagnosis not present

## 2021-06-13 DIAGNOSIS — H6123 Impacted cerumen, bilateral: Secondary | ICD-10-CM | POA: Diagnosis not present

## 2021-07-28 DIAGNOSIS — N1832 Chronic kidney disease, stage 3b: Secondary | ICD-10-CM | POA: Diagnosis not present

## 2021-07-28 DIAGNOSIS — G4700 Insomnia, unspecified: Secondary | ICD-10-CM | POA: Diagnosis not present

## 2021-07-28 DIAGNOSIS — E7800 Pure hypercholesterolemia, unspecified: Secondary | ICD-10-CM | POA: Diagnosis not present

## 2021-07-28 DIAGNOSIS — E1122 Type 2 diabetes mellitus with diabetic chronic kidney disease: Secondary | ICD-10-CM | POA: Diagnosis not present

## 2021-07-28 DIAGNOSIS — I129 Hypertensive chronic kidney disease with stage 1 through stage 4 chronic kidney disease, or unspecified chronic kidney disease: Secondary | ICD-10-CM | POA: Diagnosis not present

## 2021-07-28 DIAGNOSIS — F331 Major depressive disorder, recurrent, moderate: Secondary | ICD-10-CM | POA: Diagnosis not present

## 2021-07-28 DIAGNOSIS — K219 Gastro-esophageal reflux disease without esophagitis: Secondary | ICD-10-CM | POA: Diagnosis not present

## 2021-08-30 DIAGNOSIS — K219 Gastro-esophageal reflux disease without esophagitis: Secondary | ICD-10-CM | POA: Diagnosis not present

## 2021-08-30 DIAGNOSIS — E7800 Pure hypercholesterolemia, unspecified: Secondary | ICD-10-CM | POA: Diagnosis not present

## 2021-08-30 DIAGNOSIS — I129 Hypertensive chronic kidney disease with stage 1 through stage 4 chronic kidney disease, or unspecified chronic kidney disease: Secondary | ICD-10-CM | POA: Diagnosis not present

## 2021-08-30 DIAGNOSIS — G4700 Insomnia, unspecified: Secondary | ICD-10-CM | POA: Diagnosis not present

## 2021-08-30 DIAGNOSIS — E1122 Type 2 diabetes mellitus with diabetic chronic kidney disease: Secondary | ICD-10-CM | POA: Diagnosis not present

## 2021-08-30 DIAGNOSIS — N1832 Chronic kidney disease, stage 3b: Secondary | ICD-10-CM | POA: Diagnosis not present

## 2021-08-30 DIAGNOSIS — F331 Major depressive disorder, recurrent, moderate: Secondary | ICD-10-CM | POA: Diagnosis not present

## 2021-09-11 DIAGNOSIS — L570 Actinic keratosis: Secondary | ICD-10-CM | POA: Diagnosis not present

## 2021-10-31 DIAGNOSIS — I129 Hypertensive chronic kidney disease with stage 1 through stage 4 chronic kidney disease, or unspecified chronic kidney disease: Secondary | ICD-10-CM | POA: Diagnosis not present

## 2021-10-31 DIAGNOSIS — N1832 Chronic kidney disease, stage 3b: Secondary | ICD-10-CM | POA: Diagnosis not present

## 2021-10-31 DIAGNOSIS — L570 Actinic keratosis: Secondary | ICD-10-CM | POA: Diagnosis not present

## 2021-10-31 DIAGNOSIS — E1122 Type 2 diabetes mellitus with diabetic chronic kidney disease: Secondary | ICD-10-CM | POA: Diagnosis not present

## 2021-11-29 DIAGNOSIS — N1832 Chronic kidney disease, stage 3b: Secondary | ICD-10-CM | POA: Diagnosis not present

## 2021-11-29 DIAGNOSIS — E1122 Type 2 diabetes mellitus with diabetic chronic kidney disease: Secondary | ICD-10-CM | POA: Diagnosis not present

## 2021-11-29 DIAGNOSIS — R051 Acute cough: Secondary | ICD-10-CM | POA: Diagnosis not present

## 2021-11-29 DIAGNOSIS — I129 Hypertensive chronic kidney disease with stage 1 through stage 4 chronic kidney disease, or unspecified chronic kidney disease: Secondary | ICD-10-CM | POA: Diagnosis not present

## 2021-12-14 DIAGNOSIS — E1122 Type 2 diabetes mellitus with diabetic chronic kidney disease: Secondary | ICD-10-CM | POA: Diagnosis not present

## 2021-12-19 DIAGNOSIS — L570 Actinic keratosis: Secondary | ICD-10-CM | POA: Diagnosis not present

## 2022-02-27 DIAGNOSIS — D225 Melanocytic nevi of trunk: Secondary | ICD-10-CM | POA: Diagnosis not present

## 2022-02-27 DIAGNOSIS — D485 Neoplasm of uncertain behavior of skin: Secondary | ICD-10-CM | POA: Diagnosis not present

## 2022-02-27 DIAGNOSIS — L814 Other melanin hyperpigmentation: Secondary | ICD-10-CM | POA: Diagnosis not present

## 2022-02-27 DIAGNOSIS — D2239 Melanocytic nevi of other parts of face: Secondary | ICD-10-CM | POA: Diagnosis not present

## 2022-02-27 DIAGNOSIS — L821 Other seborrheic keratosis: Secondary | ICD-10-CM | POA: Diagnosis not present

## 2022-02-27 DIAGNOSIS — L718 Other rosacea: Secondary | ICD-10-CM | POA: Diagnosis not present

## 2022-03-29 DIAGNOSIS — R809 Proteinuria, unspecified: Secondary | ICD-10-CM | POA: Diagnosis not present

## 2022-03-29 DIAGNOSIS — I10 Essential (primary) hypertension: Secondary | ICD-10-CM | POA: Diagnosis not present

## 2022-03-29 DIAGNOSIS — G4733 Obstructive sleep apnea (adult) (pediatric): Secondary | ICD-10-CM | POA: Diagnosis not present

## 2022-03-29 DIAGNOSIS — E7800 Pure hypercholesterolemia, unspecified: Secondary | ICD-10-CM | POA: Diagnosis not present

## 2022-03-29 DIAGNOSIS — E1165 Type 2 diabetes mellitus with hyperglycemia: Secondary | ICD-10-CM | POA: Diagnosis not present

## 2022-03-29 DIAGNOSIS — N1832 Chronic kidney disease, stage 3b: Secondary | ICD-10-CM | POA: Diagnosis not present

## 2022-04-10 ENCOUNTER — Ambulatory Visit (INDEPENDENT_AMBULATORY_CARE_PROVIDER_SITE_OTHER): Payer: HMO | Admitting: Physician Assistant

## 2022-04-10 ENCOUNTER — Encounter: Payer: Self-pay | Admitting: Physician Assistant

## 2022-04-10 DIAGNOSIS — F411 Generalized anxiety disorder: Secondary | ICD-10-CM | POA: Diagnosis not present

## 2022-04-10 DIAGNOSIS — F329 Major depressive disorder, single episode, unspecified: Secondary | ICD-10-CM | POA: Diagnosis not present

## 2022-04-10 DIAGNOSIS — G4700 Insomnia, unspecified: Secondary | ICD-10-CM | POA: Diagnosis not present

## 2022-04-10 MED ORDER — AUVELITY 45-105 MG PO TBCR: 45.0000 mg | EXTENDED_RELEASE_TABLET | Freq: Two times a day (BID) | ORAL | 1 refills | Status: DC

## 2022-04-10 NOTE — Progress Notes (Signed)
[facility]  Patient ID: Blake Narez.,  MRN: [id number]  PCP: Wenda Low, MD  Date of Evaluation: 04/10/2022 Time spent:30 minutes  Chief Complaint:  Chief Complaint   Depression; Anxiety; Follow-up    HISTORY/CURRENT STATUS: HPI 6 months overdue for med check.  States he doesn't have purpose. Energy and motivation depend on the day.  Feels sad. No easy crying. "I wish I could sometimes. " ADLs and personal hygiene are normal.   Denies any changes in concentration, making decisions, or remembering things.  Appetite has not changed.  Weight is stable.  Trouble falling asleep some nights. Takes the Xanax and that helps sometimes. He doesn't take Denies suicidal or homicidal thoughts.  Patient denies increased energy with decreased need for sleep, increased talkativeness, racing thoughts, impulsivity or risky behaviors, increased spending, increased libido, grandiosity, increased irritability or anger, paranoia, and no hallucinations.  Denies dizziness, syncope, seizures, numbness, tingling, tremor, tics, unsteady gait, slurred speech, confusion. Denies muscle or joint pain, stiffness, or dystonia.  Individual Medical History/ Review of Systems: Changes? :No     Past medications for mental health diagnoses include: Cymbalta, Wellbutrin, Deplin, Elavil, Pamelor, Serzone, Nardil, Depakote, Prozac, Zoloft, Celexa, trazodone, Effexor, lithium, Abilify, Lamictal, mirtazapine, Lunesta, Restoril, Sonata, Xanax.  Allergies: Flomax [tamsulosin hcl], Ozempic (0.25 or 0.5 mg-dose) [semaglutide(0.25 or 0.'5mg'$ -dos)], Trulicity [dulaglutide], and Victoza [liraglutide]  Current Medications:  Current Outpatient Medications:    acetaminophen (TYLENOL) 500 MG tablet, Take 500 mg by mouth., Disp: , Rfl:    ALPRAZolam (XANAX) 0.5 MG tablet, Take 1-2 tablets (0.5-1 mg total) by mouth at bedtime as needed for anxiety., Disp: 60 tablet, Rfl: 5   amLODipine (NORVASC) 2.5 MG tablet, Take 2.5  mg by mouth daily., Disp: , Rfl:    aspirin EC 81 MG tablet, Take 81 mg by mouth daily., Disp: , Rfl:    bismuth subsalicylate (PEPTO BISMOL) 262 MG chewable tablet, Chew 524 mg by mouth as needed., Disp: , Rfl:    Dextromethorphan-buPROPion ER (AUVELITY) 45-105 MG TBCR, Take 45-105 mg by mouth 2 (two) times daily., Disp: 60 tablet, Rfl: 1   famotidine (PEPCID) 20 MG tablet, 20 mg daily. , Disp: , Rfl:    gabapentin (NEURONTIN) 300 MG capsule, Take 300 mg by mouth daily., Disp: , Rfl:    glipiZIDE (GLUCOTROL XL) 2.5 MG 24 hr tablet, TAKE 1 TABLET BY MOUTH ONCE DAILY IN THE MORNING WITH BREAKFAST, Disp: , Rfl:    hydrocortisone 2.5 % cream, Apply topically as needed., Disp: , Rfl:    loratadine (CLARITIN) 10 MG tablet, Take 10 mg by mouth daily as needed for allergies., Disp: , Rfl:    lovastatin (MEVACOR) 40 MG tablet, Take 40 mg by mouth at bedtime. , Disp: , Rfl:    sildenafil (VIAGRA) 100 MG tablet, Take 100 mg by mouth as needed for erectile dysfunction., Disp: , Rfl:    TRESIBA FLEXTOUCH 100 UNIT/ML FlexTouch Pen, , Disp: , Rfl:    fluticasone (FLONASE) 50 MCG/ACT nasal spray, Place into both nostrils as needed for allergies or rhinitis. (Patient not taking: Reported on 06/20/2020), Disp: , Rfl:    JANUVIA 50 MG tablet, Take 50 mg by mouth daily. (Patient not taking: Reported on 12/27/2020), Disp: , Rfl:    lisinopril (PRINIVIL,ZESTRIL) 20 MG tablet, Take 20 mg by mouth daily. (Patient not taking: Reported on 06/20/2020), Disp: , Rfl:    metFORMIN (GLUCOPHAGE) 1000 MG tablet, Take 1,000 mg by mouth 2 (two) times daily with a meal. (Patient  not taking: Reported on 06/20/2020), Disp: , Rfl:    Multiple Vitamins-Minerals (EMERGEN-C IMMUNE PO), Take by mouth as needed. (Patient not taking: Reported on 04/10/2022), Disp: , Rfl:    oxyCODONE-acetaminophen (ROXICET) 5-325 MG tablet, Take 1-2 tablets by mouth every 6 (six) hours as needed for severe pain. (Patient not taking: Reported on 12/22/2018), Disp:  12 tablet, Rfl: 0   RYBELSUS 7 MG TABS, Take 1 tablet by mouth every morning. (Patient not taking: Reported on 04/10/2022), Disp: , Rfl:    TRULICITY 3.71 GG/2.6RS SOPN, , Disp: , Rfl:  Medication Side Effects: none  Family Medical/ Social History: Changes? Yes, see HPI  MENTAL HEALTH EXAM:  There were no vitals taken for this visit.There is no height or weight on file to calculate BMI.  General Appearance: Casual and Well Groomed  Eye Contact:  Good  Speech:  Clear and Coherent and Normal Rate  Volume:  Normal  Mood:  Depressed  Affect:  Congruent  Thought Process:  Goal Directed and Descriptions of Associations: Circumstantial  Orientation:  Full (Time, Place, and Person)  Thought Content: Logical   Suicidal Thoughts:  No  Homicidal Thoughts:  No  Memory:  WNL  Judgement:  Good  Insight:  Good  Psychomotor Activity:  Normal  Concentration:  Concentration: Good  Recall:  Good  Fund of Knowledge: Good  Language: Good  Assets:  Desire for Improvement  ADL's:  Intact  Cognition: WNL  Prognosis:  Good   ECT-MADRS    Flowsheet Row Office Visit from 04/10/2022 in [facility]  MADRS Total Score 29      GAD-7    Flowsheet Row Office Visit from 06/09/2018 in [facility]  Total GAD-7 Score 5      PHQ2-9    [place] Patient Outreach Telephone from 06/20/2020 in [place] Patient Outreach Telephone from 02/12/2019 in [place] Visit from 06/09/2018 in [facility]  PHQ-2 Total Score 1 0 4  PHQ-9 Total Score -- -- 15        DIAGNOSES:    ICD-10-CM   1. Treatment-resistant depression  F32.9     2. Generalized anxiety disorder  F41.1     3. Insomnia, unspecified type  G47.00       Receiving Psychotherapy: No   RECOMMENDATIONS:  PDMP was reviewed. Last Xanax filled 07/31/2021. I provided 30 minutes of face to face time during this encounter, including time spent before and  after the visit in records review, medical decision making, counseling pertinent to today's visit, and charting.   Discussed different options to tx depression. Over many years, he's tried multiple meds that didn't work or cause SE. He's nervous to start anything new. Discussed Auvelity and that it has Wellbutrin + dextromethorphan, which can help more than the Wellbutrin alone. Benefits, risks, SE discussed and he'd like to try it. Also discussed Spravato tx, what it is, would be her 2 hours, twice a week for a month, then 1 time a wk for 1 month, then every other week is usual dosing. Can't drive after tx. He spoke w/ Dory Larsen, LPN about this as well, got more info from her and was shown the spravato clinic. All questions were answered to his satisfaction.    D/C Wellbutrin. Continue Xanax 0.5 mg, 1-2 nightly as needed sleep. Start Auvelity 45-105 mg, 1 po bid.  Continue gabapentin 300 mg daily.  Per another provider. Strongly recommend counseling.  Return in 6 weeks.  Donnal Moat, PA-C

## 2022-04-16 ENCOUNTER — Telehealth: Payer: Self-pay | Admitting: Physician Assistant

## 2022-04-16 NOTE — Telephone Encounter (Signed)
Spoke to pt he has not started med due to concerns noted above.He is also concerned about the dextromethorphan in the [place] because it makes him very dizzy.He does not think he should take the med

## 2022-04-16 NOTE — Telephone Encounter (Signed)
I discussed all those issues with him at our visit.  He was made aware that it does have bupropion and that is why I told him to stop what he is taking now and switch to the [place].  It is fine if he does not want to try it.  Stay on the Wellbutrin only.

## 2022-04-16 NOTE — Telephone Encounter (Signed)
Pt called with concerns about the new med Auvelity from apt 8/8. He noticed side effects for renal issues. He has chronic kidney issues stage 3b. Also, concerned with sexual  issues. And to report med has [place] in it. He already takes 400 mg of Burprion daily.  Contact pt @ [phone]. Pt has not picked med up.

## 2022-04-16 NOTE — Telephone Encounter (Signed)
Pt decided he will continue bupropion

## 2022-04-17 ENCOUNTER — Telehealth: Payer: Self-pay

## 2022-04-17 NOTE — Telephone Encounter (Signed)
I received a prior authorization for Auvelity, according to phone message he's not going to take so I won't submit it? Or go ahead just in case?

## 2022-04-17 NOTE — Telephone Encounter (Signed)
Noted thanks °

## 2022-04-17 NOTE — Telephone Encounter (Signed)
No, don't submit. I'll re-order it later if he agrees to try it.

## 2022-04-23 ENCOUNTER — Telehealth: Payer: Self-pay | Admitting: Physician Assistant

## 2022-04-23 NOTE — Telephone Encounter (Signed)
Yes, Traci can you do the PA?

## 2022-04-23 NOTE — Telephone Encounter (Signed)
Prior Authorization submitted for AUVELITY 45-105 MG with [employer], pending response

## 2022-04-23 NOTE — Telephone Encounter (Signed)
Next visit is 05/23/22. Blake Young wants to try Auvility but has some questions about it before he starts it. His phone number is [phone].

## 2022-04-23 NOTE — Telephone Encounter (Signed)
Pt decided not to get spravato treatment and therefore he does want to try auvelity.He is concerned about the price.You already sent the rx but told traci not to do PA since he was not taking.Should she go ahead and do the PA so he will know if it's affordable?

## 2022-04-25 NOTE — Telephone Encounter (Signed)
Prior Approval received effective 04/24/2022-09/02/2022 for AUVELITY 45-105 MG with [place] [employer].   Blake Young please let him know. You may want to check with his pharmacy first to check cost. He is medicare so we can't use co-pay card.

## 2022-04-25 NOTE — Telephone Encounter (Signed)
Blake Young- Will pt be able to do $100.00 for Auvelity?

## 2022-04-25 NOTE — Telephone Encounter (Signed)
I'm not sure if affordable for him or not, will wait and see if he calls back about it.

## 2022-04-25 NOTE — Telephone Encounter (Signed)
Patient notified. Copay $100.

## 2022-05-02 ENCOUNTER — Other Ambulatory Visit: Payer: Self-pay | Admitting: Physician Assistant

## 2022-05-14 DIAGNOSIS — Z23 Encounter for immunization: Secondary | ICD-10-CM | POA: Diagnosis not present

## 2022-05-14 DIAGNOSIS — R109 Unspecified abdominal pain: Secondary | ICD-10-CM | POA: Diagnosis not present

## 2022-05-23 ENCOUNTER — Ambulatory Visit: Payer: HMO | Admitting: Physician Assistant

## 2022-05-28 ENCOUNTER — Telehealth: Payer: Self-pay | Admitting: Physician Assistant

## 2022-05-28 NOTE — Telephone Encounter (Signed)
PT Lvm @ 9:30a.  He said he has a question about the literature on the Auvelity vs how Helene Kelp told him to take it.  He said he knows he is stop taking the Buproprion before he starts taking the Auvelity but he also has concerns about the amount of Buproprion in the [place].  Next appt 10/26

## 2022-05-28 NOTE — Telephone Encounter (Signed)
Spoke to pt and gave him instructions from note

## 2022-06-04 DIAGNOSIS — R809 Proteinuria, unspecified: Secondary | ICD-10-CM | POA: Diagnosis not present

## 2022-06-04 DIAGNOSIS — N1832 Chronic kidney disease, stage 3b: Secondary | ICD-10-CM | POA: Diagnosis not present

## 2022-06-04 DIAGNOSIS — N521 Erectile dysfunction due to diseases classified elsewhere: Secondary | ICD-10-CM | POA: Diagnosis not present

## 2022-06-04 DIAGNOSIS — R0981 Nasal congestion: Secondary | ICD-10-CM | POA: Diagnosis not present

## 2022-06-04 DIAGNOSIS — M542 Cervicalgia: Secondary | ICD-10-CM | POA: Diagnosis not present

## 2022-06-04 DIAGNOSIS — K219 Gastro-esophageal reflux disease without esophagitis: Secondary | ICD-10-CM | POA: Diagnosis not present

## 2022-06-04 DIAGNOSIS — Z1331 Encounter for screening for depression: Secondary | ICD-10-CM | POA: Diagnosis not present

## 2022-06-04 DIAGNOSIS — E1165 Type 2 diabetes mellitus with hyperglycemia: Secondary | ICD-10-CM | POA: Diagnosis not present

## 2022-06-04 DIAGNOSIS — Z0000 Encounter for general adult medical examination without abnormal findings: Secondary | ICD-10-CM | POA: Diagnosis not present

## 2022-06-04 DIAGNOSIS — E7800 Pure hypercholesterolemia, unspecified: Secondary | ICD-10-CM | POA: Diagnosis not present

## 2022-06-04 DIAGNOSIS — G4733 Obstructive sleep apnea (adult) (pediatric): Secondary | ICD-10-CM | POA: Diagnosis not present

## 2022-06-04 DIAGNOSIS — I10 Essential (primary) hypertension: Secondary | ICD-10-CM | POA: Diagnosis not present

## 2022-06-04 DIAGNOSIS — F3341 Major depressive disorder, recurrent, in partial remission: Secondary | ICD-10-CM | POA: Diagnosis not present

## 2022-06-28 ENCOUNTER — Ambulatory Visit (INDEPENDENT_AMBULATORY_CARE_PROVIDER_SITE_OTHER): Payer: HMO | Admitting: Physician Assistant

## 2022-06-28 ENCOUNTER — Encounter: Payer: Self-pay | Admitting: Physician Assistant

## 2022-06-28 DIAGNOSIS — F329 Major depressive disorder, single episode, unspecified: Secondary | ICD-10-CM | POA: Diagnosis not present

## 2022-06-28 DIAGNOSIS — F411 Generalized anxiety disorder: Secondary | ICD-10-CM | POA: Diagnosis not present

## 2022-06-28 DIAGNOSIS — G4700 Insomnia, unspecified: Secondary | ICD-10-CM

## 2022-06-28 MED ORDER — ALPRAZOLAM 0.5 MG PO TABS: 0.5000 mg | ORAL_TABLET | Freq: Every day | ORAL | 2 refills | Status: DC | PRN

## 2022-06-28 NOTE — Progress Notes (Signed)
[facility]  Patient ID: Blake Mcelhiney.,  MRN: [id number]  PCP: Charlane Ferretti, MD  Date of Evaluation: 06/28/2022 Time spent:20 minutes  Chief Complaint:  Chief Complaint   Anxiety; Depression; Follow-up    HISTORY/CURRENT STATUS: HPI For routine med check.  He wasn't able to tolerate the Auvelity.  It made him really dizzy.  He took 5 pills and could not take it anymore.  He went straight back to the Wellbutrin.  He feels like he is doing fine.  Not having any symptoms of depression.  "Of course I get down sometimes like every body.  But it is not bad."  At the last visit we had also discussed Spravato.  At this time, he does not feel like he needs it.  He would have to be a lot worse to consider it due to the time commitment it will take not only for him but having a driver.  Patient is able to enjoy things.  Energy and motivation are good.  No extreme sadness, tearfulness, or feelings of hopelessness.  Sleeps well most of the time. ADLs and personal hygiene are normal.   Denies any changes in concentration, making decisions, or remembering things.  Appetite has not changed.  Weight is stable.  Anxiety is still a problem at times.  Does not have panic attacks usually.  It is more of a generalized sense of unease.  Xanax helps.  Denies suicidal or homicidal thoughts.  Patient denies increased energy with decreased need for sleep, increased talkativeness, racing thoughts, impulsivity or risky behaviors, increased spending, increased libido, grandiosity, increased irritability or anger, paranoia, or hallucinations.  Denies dizziness, syncope, seizures, numbness, tingling, tremor, tics, unsteady gait, slurred speech, confusion. Denies muscle or joint pain, stiffness, or dystonia.  Individual Medical History/ Review of Systems: Changes? :No     Past medications for mental health diagnoses include: Cymbalta, Wellbutrin, Deplin, Elavil, Pamelor, Serzone, Nardil, Depakote,  Prozac, Zoloft, Celexa, trazodone, Effexor, lithium, Abilify, Lamictal, mirtazapine, Lunesta, Restoril, Sonata, Xanax, Auvelity caused severe dizziness  Allergies: Auvelity [dextromethorphan-bupropion er], Flomax [tamsulosin hcl], Ozempic (0.25 or 0.5 mg-dose) [semaglutide(0.25 or 0.'5mg'$ -dos)], Trulicity [dulaglutide], and Victoza [liraglutide]  Current Medications:  Current Outpatient Medications:    acetaminophen (TYLENOL) 500 MG tablet, Take 500 mg by mouth., Disp: , Rfl:    amLODipine (NORVASC) 2.5 MG tablet, Take 2.5 mg by mouth daily., Disp: , Rfl:    aspirin EC 81 MG tablet, Take 81 mg by mouth daily., Disp: , Rfl:    buPROPion (WELLBUTRIN SR) 200 MG 12 hr tablet, Take 200 mg by mouth 2 (two) times daily., Disp: , Rfl:    famotidine (PEPCID) 20 MG tablet, 20 mg daily. , Disp: , Rfl:    gabapentin (NEURONTIN) 300 MG capsule, Take 300 mg by mouth daily., Disp: , Rfl:    glipiZIDE (GLUCOTROL XL) 2.5 MG 24 hr tablet, TAKE 1 TABLET BY MOUTH ONCE DAILY IN THE MORNING WITH BREAKFAST, Disp: , Rfl:    lovastatin (MEVACOR) 40 MG tablet, Take 40 mg by mouth at bedtime. , Disp: , Rfl:    sildenafil (VIAGRA) 100 MG tablet, Take 100 mg by mouth as needed for erectile dysfunction., Disp: , Rfl:    TRESIBA FLEXTOUCH 100 UNIT/ML FlexTouch Pen, , Disp: , Rfl:    ALPRAZolam (XANAX) 0.5 MG tablet, Take 1-2 tablets (0.5-1 mg total) by mouth daily as needed for anxiety., Disp: 60 tablet, Rfl: 2   bismuth subsalicylate (PEPTO BISMOL) 262 MG chewable tablet, Chew 524 mg  by mouth as needed. (Patient not taking: Reported on 06/28/2022), Disp: , Rfl:    fluticasone (FLONASE) 50 MCG/ACT nasal spray, Place into both nostrils as needed for allergies or rhinitis. (Patient not taking: Reported on 06/20/2020), Disp: , Rfl:    hydrocortisone 2.5 % cream, Apply topically as needed. (Patient not taking: Reported on 06/28/2022), Disp: , Rfl:    JANUVIA 50 MG tablet, Take 50 mg by mouth daily. (Patient not taking: Reported on  12/27/2020), Disp: , Rfl:    lisinopril (PRINIVIL,ZESTRIL) 20 MG tablet, Take 20 mg by mouth daily. (Patient not taking: Reported on 06/20/2020), Disp: , Rfl:    loratadine (CLARITIN) 10 MG tablet, Take 10 mg by mouth daily as needed for allergies. (Patient not taking: Reported on 06/28/2022), Disp: , Rfl:    metFORMIN (GLUCOPHAGE) 1000 MG tablet, Take 1,000 mg by mouth 2 (two) times daily with a meal. (Patient not taking: Reported on 06/20/2020), Disp: , Rfl:    Multiple Vitamins-Minerals (EMERGEN-C IMMUNE PO), Take by mouth as needed. (Patient not taking: Reported on 04/10/2022), Disp: , Rfl:    oxyCODONE-acetaminophen (ROXICET) 5-325 MG tablet, Take 1-2 tablets by mouth every 6 (six) hours as needed for severe pain. (Patient not taking: Reported on 12/22/2018), Disp: 12 tablet, Rfl: 0   RYBELSUS 7 MG TABS, Take 1 tablet by mouth every morning. (Patient not taking: Reported on 04/10/2022), Disp: , Rfl:    TRULICITY 9.72 YO/3.7CH SOPN, , Disp: , Rfl:  Medication Side Effects: none  Family Medical/ Social History: Changes? no  MENTAL HEALTH EXAM:  There were no vitals taken for this visit.There is no height or weight on file to calculate BMI.  General Appearance: Casual and Well Groomed  Eye Contact:  Good  Speech:  Clear and Coherent and Normal Rate  Volume:  Normal  Mood:  Euthymic  Affect:  Congruent  Thought Process:  Goal Directed and Descriptions of Associations: Circumstantial  Orientation:  Full (Time, Place, and Person)  Thought Content: Logical   Suicidal Thoughts:  No  Homicidal Thoughts:  No  Memory:  WNL  Judgement:  Good  Insight:  Good  Psychomotor Activity:  Normal  Concentration:  Concentration: Good  Recall:  Good  Fund of Knowledge: Good  Language: Good  Assets:  Desire for Improvement  ADL's:  Intact  Cognition: WNL  Prognosis:  Good   ECT-MADRS    Flowsheet Row Office Visit from 04/10/2022 in [facility]  MADRS Total Score 29      GAD-7     Flowsheet Row Office Visit from 06/09/2018 in [facility]  Total GAD-7 Score 5      PHQ2-9    [place] Patient Outreach Telephone from 06/20/2020 in [place] Patient Outreach Telephone from 02/12/2019 in [place] Visit from 06/09/2018 in [facility]  PHQ-2 Total Score 1 0 4  PHQ-9 Total Score -- -- 15       DIAGNOSES:    ICD-10-CM   1. Treatment-resistant depression  F32.9     2. Generalized anxiety disorder  F41.1     3. Insomnia, unspecified type  G47.00       Receiving Psychotherapy: No   RECOMMENDATIONS:  PDMP was reviewed. Last Xanax filled 07/31/2021. I provided 20 minutes of face to face time during this encounter, including time spent before and after the visit in records review, medical decision making, counseling pertinent to today's visit, and charting.   He is doing well at this  time and does not want to make any changes.  Continue Xanax 0.5 mg, 1-2 nightly as needed sleep. Continue Wellbutrin SR 200 mg, 1 p.o. twice daily. Continue gabapentin 300 mg daily.  Per another provider. Recommend counseling. Return in 6 months.  Donnal Moat, PA-C

## 2022-09-10 DIAGNOSIS — E7800 Pure hypercholesterolemia, unspecified: Secondary | ICD-10-CM | POA: Diagnosis not present

## 2022-09-10 DIAGNOSIS — Z79899 Other long term (current) drug therapy: Secondary | ICD-10-CM | POA: Diagnosis not present

## 2022-09-11 DIAGNOSIS — J343 Hypertrophy of nasal turbinates: Secondary | ICD-10-CM | POA: Diagnosis not present

## 2022-10-01 DIAGNOSIS — R809 Proteinuria, unspecified: Secondary | ICD-10-CM | POA: Diagnosis not present

## 2022-10-01 DIAGNOSIS — N183 Chronic kidney disease, stage 3 unspecified: Secondary | ICD-10-CM | POA: Diagnosis not present

## 2022-10-01 DIAGNOSIS — N1832 Chronic kidney disease, stage 3b: Secondary | ICD-10-CM | POA: Diagnosis not present

## 2022-10-01 DIAGNOSIS — G4733 Obstructive sleep apnea (adult) (pediatric): Secondary | ICD-10-CM | POA: Diagnosis not present

## 2022-10-01 DIAGNOSIS — E785 Hyperlipidemia, unspecified: Secondary | ICD-10-CM | POA: Diagnosis not present

## 2022-10-01 DIAGNOSIS — E1165 Type 2 diabetes mellitus with hyperglycemia: Secondary | ICD-10-CM | POA: Diagnosis not present

## 2022-10-01 DIAGNOSIS — I10 Essential (primary) hypertension: Secondary | ICD-10-CM | POA: Diagnosis not present

## 2022-10-02 DIAGNOSIS — E1122 Type 2 diabetes mellitus with diabetic chronic kidney disease: Secondary | ICD-10-CM | POA: Diagnosis not present

## 2022-10-02 DIAGNOSIS — I129 Hypertensive chronic kidney disease with stage 1 through stage 4 chronic kidney disease, or unspecified chronic kidney disease: Secondary | ICD-10-CM | POA: Diagnosis not present

## 2022-10-02 DIAGNOSIS — N183 Chronic kidney disease, stage 3 unspecified: Secondary | ICD-10-CM | POA: Diagnosis not present

## 2022-10-03 DIAGNOSIS — F331 Major depressive disorder, recurrent, moderate: Secondary | ICD-10-CM | POA: Diagnosis not present

## 2022-10-10 ENCOUNTER — Other Ambulatory Visit (HOSPITAL_COMMUNITY): Payer: Self-pay

## 2022-10-10 MED ORDER — FAMOTIDINE 20 MG PO TABS
20.0000 mg | ORAL_TABLET | Freq: Two times a day (BID) | ORAL | 3 refills | Status: DC
  Filled 2022-10-10: qty 180, 90d supply, fill #0

## 2022-10-17 ENCOUNTER — Other Ambulatory Visit (HOSPITAL_COMMUNITY): Payer: Self-pay

## 2022-10-25 DIAGNOSIS — Z09 Encounter for follow-up examination after completed treatment for conditions other than malignant neoplasm: Secondary | ICD-10-CM | POA: Diagnosis not present

## 2022-10-25 DIAGNOSIS — D225 Melanocytic nevi of trunk: Secondary | ICD-10-CM | POA: Diagnosis not present

## 2022-10-25 DIAGNOSIS — Z872 Personal history of diseases of the skin and subcutaneous tissue: Secondary | ICD-10-CM | POA: Diagnosis not present

## 2022-10-25 DIAGNOSIS — L814 Other melanin hyperpigmentation: Secondary | ICD-10-CM | POA: Diagnosis not present

## 2022-10-25 DIAGNOSIS — L820 Inflamed seborrheic keratosis: Secondary | ICD-10-CM | POA: Diagnosis not present

## 2022-11-01 ENCOUNTER — Other Ambulatory Visit (HOSPITAL_COMMUNITY): Payer: Self-pay

## 2022-11-02 ENCOUNTER — Other Ambulatory Visit (HOSPITAL_COMMUNITY): Payer: Self-pay

## 2022-11-05 ENCOUNTER — Other Ambulatory Visit (HOSPITAL_COMMUNITY): Payer: Self-pay

## 2022-11-06 ENCOUNTER — Other Ambulatory Visit (HOSPITAL_COMMUNITY): Payer: Self-pay

## 2022-11-07 ENCOUNTER — Other Ambulatory Visit (HOSPITAL_COMMUNITY): Payer: Self-pay

## 2022-11-07 ENCOUNTER — Other Ambulatory Visit: Payer: Self-pay

## 2022-11-07 MED ORDER — LOVASTATIN 40 MG PO TABS
40.0000 mg | ORAL_TABLET | Freq: Every evening | ORAL | 3 refills | Status: DC
  Filled 2022-11-07: qty 90, 90d supply, fill #0

## 2022-11-08 ENCOUNTER — Other Ambulatory Visit: Payer: Self-pay

## 2022-11-08 ENCOUNTER — Other Ambulatory Visit (HOSPITAL_COMMUNITY): Payer: Self-pay

## 2022-12-06 DIAGNOSIS — F331 Major depressive disorder, recurrent, moderate: Secondary | ICD-10-CM | POA: Diagnosis not present

## 2022-12-06 DIAGNOSIS — Z79899 Other long term (current) drug therapy: Secondary | ICD-10-CM | POA: Diagnosis not present

## 2022-12-06 DIAGNOSIS — E7800 Pure hypercholesterolemia, unspecified: Secondary | ICD-10-CM | POA: Diagnosis not present

## 2022-12-06 DIAGNOSIS — G4733 Obstructive sleep apnea (adult) (pediatric): Secondary | ICD-10-CM | POA: Diagnosis not present

## 2022-12-06 DIAGNOSIS — K219 Gastro-esophageal reflux disease without esophagitis: Secondary | ICD-10-CM | POA: Diagnosis not present

## 2022-12-06 DIAGNOSIS — N1832 Chronic kidney disease, stage 3b: Secondary | ICD-10-CM | POA: Diagnosis not present

## 2022-12-06 DIAGNOSIS — E1122 Type 2 diabetes mellitus with diabetic chronic kidney disease: Secondary | ICD-10-CM | POA: Diagnosis not present

## 2022-12-11 ENCOUNTER — Ambulatory Visit: Payer: PPO | Admitting: Physician Assistant

## 2022-12-11 ENCOUNTER — Encounter: Payer: Self-pay | Admitting: Physician Assistant

## 2022-12-11 VITALS — BP 160/86 | HR 97 | Wt 169.2 lb

## 2022-12-11 DIAGNOSIS — M6208 Separation of muscle (nontraumatic), other site: Secondary | ICD-10-CM

## 2022-12-11 DIAGNOSIS — Z1211 Encounter for screening for malignant neoplasm of colon: Secondary | ICD-10-CM

## 2022-12-11 DIAGNOSIS — K219 Gastro-esophageal reflux disease without esophagitis: Secondary | ICD-10-CM | POA: Diagnosis not present

## 2022-12-11 MED ORDER — NA SULFATE-K SULFATE-MG SULF 17.5-3.13-1.6 GM/177ML PO SOLN: 1.0000 | Freq: Once | ORAL | 0 refills | Status: AC

## 2022-12-11 NOTE — Patient Instructions (Addendum)
_______________________________________________________  If your blood pressure at your visit was 140/90 or greater, please contact your primary care physician to follow up on this.  _______________________________________________________  If you are age 73 or older, your body mass index should be between 23-30. Your Body mass index is 25.73 kg/m. If this is out of the aforementioned range listed, please consider follow up with your Primary Care Provider.  If you are age 68 or younger, your body mass index should be between 19-25. Your Body mass index is 25.73 kg/m. If this is out of the aformentioned range listed, please consider follow up with your Primary Care Provider.   You have been scheduled for an endoscopy and colonoscopy. Please follow the written instructions given to you at your visit today. Please pick up your prep supplies at the pharmacy within the next 1-3 days. If you use inhalers (even only as needed), please bring them with you on the day of your procedure.   We have sent the following medications to your pharmacy for you to pick up at your convenience: Omeprazole 20 mg 30-60 minutes before dinner.   The [place] [facility] providers would like to encourage you to use [facility] to communicate with providers for non-urgent requests or questions.  Due to long hold times on the telephone, sending your provider a message by [facility] may be a faster and more efficient way to get a response.  Please allow 48 business hours for a response.  Please remember that this is for non-urgent requests.   It was a pleasure to see you today!  Thank you for trusting me with your gastrointestinal care!

## 2022-12-11 NOTE — Progress Notes (Signed)
Chief Complaint: Discuss colonoscopy and GERD  HPI:    [name] is a 72 year old male with a past medical history as listed below including diabetes, reflux and multiple others, who was referred to me by Thana Atesaju, Sneha P, MD for a complaint of GERD and to discuss a colonoscopy.    09/25/2011 [facility] at [facility] was completely normal.  Repeat recommended in 10 years.    Today, the patient tells me that his last colonoscopy was more than 10 years ago and his doctors have been telling him he is due for another one.  He did okay with last bowel prep and wants to get this scheduled.    More acutely today patient discusses that he has been having a lot of reflux and has been having to sit up at night in order to avoid symptoms of regurgitation and "acid in my throat", also has dull pain in his epigastrium at times.  He does tell me he typically does not go to bed until 2 or 3 in the morning.  He has tried to stop eating earlier on in the evening.  Currently on Famotidine 20 mg twice daily and as needed Pepto-Bismol because of his CKD.  Tells me he has tried Rybelsus and Ozempic and other products like this in the past which was the only thing that really helps his blood sugars but they worsen his GI symptoms so he is not on them now.  Associated symptoms include some bloating when his reflux is bad.    Also asks about his gallstones which were found on recent imaging study as well as diastases recti.    Denies fever, chills, weight loss, blood in his stool, nausea or vomiting.  Past Medical History:  Diagnosis Date   Depression    Diabetes mellitus without complication    GERD (gastroesophageal reflux disease)    Herniated cervical disc    History of kidney stones    Hypertension    Neuromuscular disorder    Sleep apnea    does not use CPAP     Past Surgical History:  Procedure Laterality Date   BASAL CELL CARCINOMA EXCISION  2016   UPPER RIGHT CHEST   COLONOSCOPY  2013    CYSTOSCOPY/URETEROSCOPY/HOLMIUM LASER/STENT PLACEMENT Bilateral 05/02/2018   Procedure: CYSTOSCOPY/URETEROSCOPY/RIGHT RETROGRADE/BILATERAL STENT REMOVAL/STENT PLACEMENT LEFT URETER;  Surgeon: Jerilee FieldEskridge, Matthew, MD;  [facility];  Service: Urology;  Laterality: Bilateral;   EXTRACORPOREAL SHOCK WAVE LITHOTRIPSY Left 04/18/2017   Procedure: LEFT EXTRACORPOREAL SHOCK WAVE LITHOTRIPSY (ESWL);  Surgeon: Bjorn PippinWrenn, John, MD;  Location: [facility];  Service: Urology;  Laterality: Left;   EXTRACORPOREAL SHOCK WAVE LITHOTRIPSY Right 04/07/2018   Procedure: RIGHT EXTRACORPOREAL SHOCK WAVE LITHOTRIPSY (ESWL);  Surgeon: Crista ElliotBell, Eugene D III, MD;  Location: [facility];  Service: Urology;  Laterality: Right;   EYE SURGERY Bilateral    CATARACTS   URETEROSCOPY  2019   with stents insertion    Current Outpatient Medications  Medication Sig Dispense Refill   acetaminophen (TYLENOL) 500 MG tablet Take 500 mg by mouth.     ALPRAZolam (XANAX) 0.5 MG tablet Take 1-2 tablets (0.5-1 mg total) by mouth daily as needed for anxiety. 60 tablet 2   amLODipine (NORVASC) 2.5 MG tablet Take 2.5 mg by mouth daily.     aspirin EC 81 MG tablet Take 81 mg by mouth daily.     bismuth subsalicylate (PEPTO BISMOL) 262 MG chewable tablet Chew 524 mg by mouth as needed.  buPROPion (WELLBUTRIN SR) 200 MG 12 hr tablet Take 200 mg by mouth 2 (two) times daily.     famotidine (PEPCID) 20 MG tablet Take 1 tablet (20 mg total) by mouth 2 (two) times daily. 180 tablet 3   fluticasone (FLONASE) 50 MCG/ACT nasal spray Place into both nostrils as needed for allergies or rhinitis.     gabapentin (NEURONTIN) 300 MG capsule Take 300 mg by mouth daily.     glipiZIDE (GLUCOTROL XL) 2.5 MG 24 hr tablet TAKE 1 TABLET BY MOUTH ONCE DAILY IN THE MORNING WITH BREAKFAST     hydrocortisone 2.5 % cream Apply topically as needed.     loratadine (CLARITIN) 10 MG tablet Take 10 mg by mouth daily as needed for allergies.     Multiple  Vitamins-Minerals (EMERGEN-C IMMUNE PO) Take by mouth as needed.     sildenafil (VIAGRA) 100 MG tablet Take 100 mg by mouth as needed for erectile dysfunction.     TRESIBA FLEXTOUCH 100 UNIT/ML FlexTouch Pen      No current facility-administered medications for this visit.    Allergies as of 12/11/2022 - Review Complete 12/11/2022  Allergen Reaction Noted   Auvelity [dextromethorphan-bupropion er] Other (See Comments) 06/28/2022   Flomax [tamsulosin hcl] Other (See Comments) 06/20/2020   Ozempic (0.25 or 0.5 mg-dose) [semaglutide(0.25 or 0.5mg -dos)] Other (See Comments) 06/20/2020   Trulicity [dulaglutide] Other (See Comments) 06/20/2020   Victoza [liraglutide] Other (See Comments) 06/20/2020    Family History  Problem Relation Age of Onset   Alzheimer's disease Mother    Kidney disease Father    Diabetes Brother    Colon cancer Neg Hx    Rectal cancer Neg Hx    Stomach cancer Neg Hx    Esophageal cancer Neg Hx    Pancreatic cancer Neg Hx    Liver cancer Neg Hx     Social History   Socioeconomic History   Marital status: Single    Spouse name: Not on file   Number of children: 6   Years of education: Not on file   Highest education level: Not on file  Occupational History   Not on file  Tobacco Use   Smoking status: Never   Smokeless tobacco: Never  Vaping Use   Vaping Use: Never used  Substance and Sexual Activity   Alcohol use: No   Drug use: No   Sexual activity: Not on file  Other Topics Concern   Not on file  Social History Narrative   Not on file   Social Determinants of Health   Financial Resource Strain: Not on file  Food Insecurity: Not on file  Transportation Needs: Not on file  Physical Activity: Not on file  Stress: Not on file  Social Connections: Not on file  Intimate Partner Violence: Not on file    Review of Systems:    Constitutional: No weight loss, fever or chills Cardiovascular: No chest pain  Respiratory: No SOB   Gastrointestinal: See HPI and otherwise negative Genitourinary: No dysuria Neurological: No headache, dizziness or syncope Musculoskeletal: No new muscle or joint pain Hematologic: No bleeding or bruising Psychiatric: No history of depression or anxiety   Physical Exam:  Vital signs: BP (!) 160/86   Pulse 97   Wt 169 lb 4 oz (76.8 kg)   SpO2 97%   BMI 25.73 kg/m    Constitutional:   Pleasant Caucasian male appears to be in NAD, Well developed, Well nourished, alert and cooperative Head:  Normocephalic and atraumatic.  Eyes:   PEERL, EOMI. No icterus. Conjunctiva pink. Ears:  Normal auditory acuity. Neck:  Supple Throat: Oral cavity and pharynx without inflammation, swelling or lesion.  Respiratory: Respirations even and unlabored. Lungs clear to auscultation bilaterally.   No wheezes, crackles, or rhonchi.  Cardiovascular: Normal S1, S2. No MRG. Regular rate and rhythm. No peripheral edema, cyanosis or pallor.  Gastrointestinal:  Soft, nondistended, nontender. No rebound or guarding. Normal bowel sounds. No appreciable masses or hepatomegaly. Rectal:  Not performed.  Msk:  Symmetrical without gross deformities. Without edema, no deformity or joint abnormality.  Neurologic:  Alert and  oriented x4;  grossly normal neurologically.  Skin:   Dry and intact without significant lesions or rashes. Psychiatric: Demonstrates good judgement and reason without abnormal affect or behaviors.  See HPI for recent labs.  Assessment: 1.  Screening for colorectal cancer: Last colonoscopy normal in 2013, overdue for repeat 2.  GERD: Increase symptoms most recently, minimally helped by Famotidine 20 twice daily  Plan: 1.  Scheduled patient for screening colonoscopy and diagnostic EGD in the [facility] with Dr. Rhea Belton.  Did provide the patient a detailed list risks for the procedures and he agrees to proceed. Patient is appropriate for endoscopic procedure(s) in the ambulatory ([facility]) setting.  2.  Started  the patient on Omeprazole 20 mg 30 to 60 minutes before dinner.  #30 with 11 refills 3.  Reviewed patient's questions about gallstones and his diastases recti and spent time reassuring him and answering. 4.  Patient to follow in clinic per recommendations from Dr. Rhea Belton after time of procedures.  Hyacinth Meeker, PA-C [facility] 12/11/2022, 1:56 PM  Cc: Thana Ates, MD

## 2022-12-14 ENCOUNTER — Telehealth: Payer: Self-pay | Admitting: Physician Assistant

## 2022-12-14 ENCOUNTER — Other Ambulatory Visit: Payer: Self-pay

## 2022-12-14 DIAGNOSIS — K219 Gastro-esophageal reflux disease without esophagitis: Secondary | ICD-10-CM

## 2022-12-14 DIAGNOSIS — Z1211 Encounter for screening for malignant neoplasm of colon: Secondary | ICD-10-CM

## 2022-12-14 MED ORDER — NA SULFATE-K SULFATE-MG SULF 17.5-3.13-1.6 GM/177ML PO SOLN: 1.0000 | Freq: Once | ORAL | 0 refills | Status: AC

## 2022-12-14 MED ORDER — OMEPRAZOLE 20 MG PO CPDR: 20.0000 mg | DELAYED_RELEASE_CAPSULE | Freq: Every day | ORAL | 3 refills | Status: AC

## 2022-12-14 NOTE — Telephone Encounter (Signed)
Patient called stating that the medication Omeprazole was suppose to be called into [facility] on [address] but they have not received it yet. Also started that Suprep was also already called in and wanted to make sure it does not expire since his Colonoscopy is in June. Requesting a call back. Please advise.

## 2022-12-14 NOTE — Telephone Encounter (Signed)
Left VM for patient letting him know that Suprep would not expire before his procedure and that Omeprazole had been sent to his pharmacy.

## 2022-12-17 NOTE — Progress Notes (Signed)
Addendum: Reviewed and agree with assessment and management plan. Tinnie Kunin M, MD  

## 2022-12-31 ENCOUNTER — Encounter: Payer: Self-pay | Admitting: Physician Assistant

## 2022-12-31 ENCOUNTER — Ambulatory Visit (INDEPENDENT_AMBULATORY_CARE_PROVIDER_SITE_OTHER): Payer: HMO | Admitting: Physician Assistant

## 2022-12-31 DIAGNOSIS — F411 Generalized anxiety disorder: Secondary | ICD-10-CM | POA: Diagnosis not present

## 2022-12-31 DIAGNOSIS — F329 Major depressive disorder, single episode, unspecified: Secondary | ICD-10-CM | POA: Diagnosis not present

## 2022-12-31 DIAGNOSIS — G4700 Insomnia, unspecified: Secondary | ICD-10-CM

## 2022-12-31 MED ORDER — ALPRAZOLAM 0.5 MG PO TABS: 0.5000 mg | ORAL_TABLET | Freq: Every day | ORAL | 2 refills | Status: DC | PRN

## 2022-12-31 NOTE — Progress Notes (Signed)
[facility]  Patient ID: Blake Young.,  MRN: [id number]  PCP: Thana Ates, MD  Date of Evaluation: 12/31/2022 Time spent:20 minutes  Chief Complaint:  Chief Complaint   Depression; Follow-up    HISTORY/CURRENT STATUS: HPI For routine med check.  Has been severely depressed off and on this winter, feels pretty good today, but has had decreased interested.  Energy and motivation are fair to good depending on the day.  No extreme sadness, tearfulness, or feelings of hopelessness.  Sleeps fairly well. Goes to bed late and sleeps late.  ADLs and personal hygiene are normal.   Denies any changes in concentration, making decisions, or remembering things.  Appetite has not changed.  Weight is stable.  Denies suicidal or homicidal thoughts.  Patient denies increased energy with decreased need for sleep, increased talkativeness, racing thoughts, impulsivity or risky behaviors, increased spending, increased libido, grandiosity, increased irritability or anger, paranoia, or hallucinations.  Denies dizziness, syncope, seizures, numbness, tingling, tremor, tics, unsteady gait, slurred speech, confusion. Denies muscle or joint pain, stiffness, or dystonia.  Individual Medical History/ Review of Systems: Changes? :No     Past medications for mental health diagnoses include: Cymbalta, Wellbutrin, Deplin, Elavil, Pamelor, Serzone, Nardil, Depakote, Prozac, Zoloft, Celexa, trazodone, Effexor, lithium, Abilify, Lamictal, mirtazapine, Lunesta, Restoril, Sonata, Xanax, Auvelity caused severe dizziness  Allergies: Auvelity [dextromethorphan-bupropion er], Flomax [tamsulosin hcl], Ozempic (0.25 or 0.5 mg-dose) [semaglutide(0.25 or 0.5mg -dos)], Trulicity [dulaglutide], and Victoza [liraglutide]  Current Medications:  Current Outpatient Medications:    acetaminophen (TYLENOL) 500 MG tablet, Take 500 mg by mouth., Disp: , Rfl:    amLODipine (NORVASC) 2.5 MG tablet, Take 2.5 mg by mouth  daily., Disp: , Rfl:    aspirin EC 81 MG tablet, Take 81 mg by mouth daily., Disp: , Rfl:    bismuth subsalicylate (PEPTO BISMOL) 262 MG chewable tablet, Chew 524 mg by mouth as needed., Disp: , Rfl:    buPROPion (WELLBUTRIN SR) 200 MG 12 hr tablet, Take 200 mg by mouth 2 (two) times daily., Disp: , Rfl:    famotidine (PEPCID) 20 MG tablet, Take 1 tablet (20 mg total) by mouth 2 (two) times daily., Disp: 180 tablet, Rfl: 3   fluticasone (FLONASE) 50 MCG/ACT nasal spray, Place into both nostrils as needed for allergies or rhinitis., Disp: , Rfl:    gabapentin (NEURONTIN) 300 MG capsule, Take 300 mg by mouth daily., Disp: , Rfl:    glipiZIDE (GLUCOTROL XL) 2.5 MG 24 hr tablet, TAKE 1 TABLET BY MOUTH ONCE DAILY IN THE MORNING WITH BREAKFAST, Disp: , Rfl:    hydrocortisone 2.5 % cream, Apply topically as needed., Disp: , Rfl:    loratadine (CLARITIN) 10 MG tablet, Take 10 mg by mouth daily as needed for allergies., Disp: , Rfl:    Multiple Vitamins-Minerals (EMERGEN-C IMMUNE PO), Take by mouth as needed., Disp: , Rfl:    omeprazole (PRILOSEC) 20 MG capsule, Take 1 capsule (20 mg total) by mouth daily. Take one capsule 30-60 minutes before dinner., Disp: 90 capsule, Rfl: 3   sildenafil (VIAGRA) 100 MG tablet, Take 100 mg by mouth as needed for erectile dysfunction., Disp: , Rfl:    TRESIBA FLEXTOUCH 100 UNIT/ML FlexTouch Pen, , Disp: , Rfl:    ALPRAZolam (XANAX) 0.5 MG tablet, Take 1-2 tablets (0.5-1 mg total) by mouth daily as needed for anxiety., Disp: 60 tablet, Rfl: 2 Medication Side Effects: none  Family Medical/ Social History: Changes? no  MENTAL HEALTH EXAM:  There were no vitals taken  for this visit.There is no height or weight on file to calculate BMI.  General Appearance: Casual and Well Groomed  Eye Contact:  Good  Speech:  Clear and Coherent and Normal Rate  Volume:  Normal  Mood:  Euthymic  Affect:  Congruent  Thought Process:  Goal Directed and Descriptions of Associations:  Circumstantial  Orientation:  Full (Time, Place, and Person)  Thought Content: Logical   Suicidal Thoughts:  No  Homicidal Thoughts:  No  Memory:  WNL  Judgement:  Good  Insight:  Good  Psychomotor Activity:  Normal  Concentration:  Concentration: Good  Recall:  Good  Fund of Knowledge: Good  Language: Good  Assets:  Desire for Improvement Financial Resources/Insurance Housing Transportation  ADL's:  Intact  Cognition: WNL  Prognosis:  Good   ECT-MADRS    Flowsheet Row Office Visit from 04/10/2022 in [facility]  MADRS Total Score 29      GAD-7    Flowsheet Row Office Visit from 06/09/2018 in [facility]  Total GAD-7 Score 5      PHQ2-9    Flowsheet Row Patient Outreach Telephone from 06/20/2020 in [facility] Patient Outreach Telephone from 02/12/2019 in [facility] Office Visit from 06/09/2018 in [facility]  PHQ-2 Total Score 1 0 4  PHQ-9 Total Score -- -- 15       DIAGNOSES:    ICD-10-CM   1. Treatment-resistant depression  F32.9     2. Generalized anxiety disorder  F41.1     3. Insomnia, unspecified type  G47.00      Receiving Psychotherapy: No   RECOMMENDATIONS:  PDMP was reviewed. Last Xanax filled 06/28/2022.  Gabapentin filled 11/08/2022 by another provider. I provided 20 minutes of face to face time during this encounter, including time spent before and after the visit in records review, medical decision making, counseling pertinent to today's visit, and charting.   Long discussion about depression and treatment options. He's been on numerous antidepressants over the years, none have really worked except for the Wellbutrin.  When he took SSRIs in the past they caused sexual side effects as well.  We discussed Trintellix and Viibryd.  Adding 1 of those to the Wellbutrin could be a good option.  He would like to do some research before he tries  any new medication.  We also discussed TMS, ECT, and Spravato.  We have discussed those in the past.  He is not interested in any of those treatments.  I recommend IOP or partial hospitalization if appropriate.  He would like to research those types of counseling and knows to call [facility] to discuss.  He has had many different counselors over the years, none of whom were good fit for him.  So at this time he prefers to make no changes but will let me know if he chooses to do so before our next visit.  He would like to keep our appointments to twice a year, which is fine as long as we do not make any changes.  Continue Xanax 0.5 mg, 1-2 nightly as needed sleep. Continue Wellbutrin SR 200 mg, 1 p.o. twice daily. Continue gabapentin 300 mg daily.  Per another provider. Return in 6 months.  Melony Overly, PA-C

## 2023-01-03 DIAGNOSIS — E1165 Type 2 diabetes mellitus with hyperglycemia: Secondary | ICD-10-CM | POA: Diagnosis not present

## 2023-01-03 DIAGNOSIS — R809 Proteinuria, unspecified: Secondary | ICD-10-CM | POA: Diagnosis not present

## 2023-01-03 DIAGNOSIS — I10 Essential (primary) hypertension: Secondary | ICD-10-CM | POA: Diagnosis not present

## 2023-01-03 DIAGNOSIS — N1832 Chronic kidney disease, stage 3b: Secondary | ICD-10-CM | POA: Diagnosis not present

## 2023-01-03 DIAGNOSIS — Z79899 Other long term (current) drug therapy: Secondary | ICD-10-CM | POA: Diagnosis not present

## 2023-01-03 DIAGNOSIS — E7800 Pure hypercholesterolemia, unspecified: Secondary | ICD-10-CM | POA: Diagnosis not present

## 2023-01-17 ENCOUNTER — Telehealth: Payer: Self-pay

## 2023-01-17 ENCOUNTER — Encounter: Payer: Self-pay | Admitting: Internal Medicine

## 2023-01-17 NOTE — Telephone Encounter (Signed)
Noted  

## 2023-01-17 NOTE — Telephone Encounter (Signed)
-----   Message from Blanch Media sent at 01/17/2023  2:15 PM EDT ----- Regarding: RE: Colon I called pt back and answered all of his questions. ----- Message ----- From: Chrystie Nose, RN Sent: 01/17/2023   2:01 PM EDT To: Marylynn Pearson; April D McPeak; # Subject: Colon                                          See question below from patient and please let him know.  4. Has my insurance company, [employer], been contacted for authorization and did they give approval? Was anything not covered?

## 2023-02-04 ENCOUNTER — Encounter: Payer: Self-pay | Admitting: Internal Medicine

## 2023-02-04 ENCOUNTER — Ambulatory Visit (AMBULATORY_SURGERY_CENTER): Payer: PPO | Admitting: Internal Medicine

## 2023-02-04 VITALS — BP 115/74 | HR 77 | Temp 99.1°F | Resp 11 | Ht 66.75 in | Wt 172.0 lb

## 2023-02-04 DIAGNOSIS — K219 Gastro-esophageal reflux disease without esophagitis: Secondary | ICD-10-CM | POA: Diagnosis not present

## 2023-02-04 DIAGNOSIS — E119 Type 2 diabetes mellitus without complications: Secondary | ICD-10-CM | POA: Diagnosis not present

## 2023-02-04 DIAGNOSIS — Z1211 Encounter for screening for malignant neoplasm of colon: Secondary | ICD-10-CM | POA: Diagnosis not present

## 2023-02-04 DIAGNOSIS — D128 Benign neoplasm of rectum: Secondary | ICD-10-CM

## 2023-02-04 DIAGNOSIS — K229 Disease of esophagus, unspecified: Secondary | ICD-10-CM | POA: Diagnosis not present

## 2023-02-04 DIAGNOSIS — D123 Benign neoplasm of transverse colon: Secondary | ICD-10-CM

## 2023-02-04 DIAGNOSIS — G4733 Obstructive sleep apnea (adult) (pediatric): Secondary | ICD-10-CM | POA: Diagnosis not present

## 2023-02-04 DIAGNOSIS — D124 Benign neoplasm of descending colon: Secondary | ICD-10-CM

## 2023-02-04 DIAGNOSIS — I10 Essential (primary) hypertension: Secondary | ICD-10-CM | POA: Diagnosis not present

## 2023-02-04 DIAGNOSIS — D125 Benign neoplasm of sigmoid colon: Secondary | ICD-10-CM

## 2023-02-04 MED ORDER — SODIUM CHLORIDE 0.9 % IV SOLN: 500.0000 mL | Freq: Once | INTRAVENOUS | Status: DC

## 2023-02-04 NOTE — Op Note (Signed)
[facility] Patient Name: Blake Young Procedure Date: 02/04/2023 1:43 PM MRN: [id number] Endoscopist: Beverley Fiedler , MD, [id number] Age: 72 Referring MD:  Date of Birth: 08-Apr-1951 Gender: Male Account #: [id number] Procedure:                Upper GI endoscopy Indications:              Epigastric abdominal pain, Heartburn; improve with                            start of omeprazole 20 mg daily (previously                            symptoms persisted with famotidine) Medicines:                Monitored Anesthesia Care Procedure:                Pre-Anesthesia Assessment:                           - Prior to the procedure, a History and Physical                            was performed, and patient medications and                            allergies were reviewed. The patient's tolerance of                            previous anesthesia was also reviewed. The risks                            and benefits of the procedure and the sedation                            options and risks were discussed with the patient.                            All questions were answered, and informed consent                            was obtained. Prior Anticoagulants: The patient has                            taken no anticoagulant or antiplatelet agents. ASA                            Grade Assessment: III - A patient with severe                            systemic disease. After reviewing the risks and                            benefits, the patient was deemed in satisfactory  condition to undergo the procedure.                           After obtaining informed consent, the endoscope was                            passed under direct vision. Throughout the                            procedure, the patient's blood pressure, pulse, and                            oxygen saturations were monitored continuously. The                            Olympus Scope [id number] was  introduced through the                            mouth, and advanced to the second part of duodenum.                            The upper GI endoscopy was accomplished without                            difficulty. The patient tolerated the procedure                            well. Scope In: Scope Out: Findings:                 The Z-line was irregular and was found 38 cm from                            the incisors. Biopsies were taken with a cold                            forceps for histology to exclude Barrett's.                           A 1 cm hiatal hernia was present.                           The entire examined stomach was normal.                           The examined duodenum was normal. Complications:            No immediate complications. Estimated Blood Loss:     Estimated blood loss was minimal. Impression:               - Z-line irregular, 38 cm from the incisors.                            Biopsied.                           -  1 cm hiatal hernia.                           - Normal stomach.                           - Normal examined duodenum. Recommendation:           - Patient has a contact number available for                            emergencies. The signs and symptoms of potential                            delayed complications were discussed with the                            patient. Return to normal activities tomorrow.                            Written discharge instructions were provided to the                            patient.                           - Resume previous diet.                           - Continue present medications. Would continue                            omeprazole 20 mg daily given improvement. Discuss                            this medication with Dr. Marisue Humble to ensure he is                            okay with chronic low-dose PPI to control GERD.                           - Await pathology results. If Barrett's present                             then PPI will be recommended chronically.                           - See the other procedure note for documentation of                            additional recommendations. Beverley Fiedler, MD 02/04/2023 2:20:22 PM This report has been signed electronically.

## 2023-02-04 NOTE — Progress Notes (Signed)
Pt resting comfortably. VSS. Airway intact. SBAR complete to RN. All questions answered.   

## 2023-02-04 NOTE — Progress Notes (Signed)
Called to room to assist during endoscopic procedure.  Patient ID and intended procedure confirmed with present staff. Received instructions for my participation in the procedure from the performing physician.  

## 2023-02-04 NOTE — Progress Notes (Signed)
Patient ID: Blake Viens., male   DOB: [birth year], 72 y.o.   MRN: [id number]    GASTROENTEROLOGY PROCEDURE H&P NOTE   Primary Care Physician: Thana Ates, MD    Reason for Procedure:  GERD, dyspeptic symptom and colon cancer screening  Plan:    EGD and colonoscopy  Patient is appropriate for endoscopic procedure(s) in the ambulatory ([facility]) setting.  The nature of the procedure, as well as the risks, benefits, and alternatives were carefully and thoroughly reviewed with the patient. Ample time for discussion and questions allowed. The patient understood, was satisfied, and agreed to proceed.     HPI: Blake Young. is a 72 y.o. male who presents for EGD and colonoscopy.  Medical history as below.  Tolerated the prep.  No recent chest pain or shortness of breath.  No abdominal pain today.  Past Medical History:  Diagnosis Date   Arthritis    Depression    Diabetes mellitus without complication (HCC)    GERD (gastroesophageal reflux disease)    Herniated cervical disc    History of kidney stones    Hyperlipidemia    Hypertension    Neuromuscular disorder (HCC)    Sleep apnea    does not use CPAP     Past Surgical History:  Procedure Laterality Date   BASAL CELL CARCINOMA EXCISION  2016   UPPER RIGHT CHEST   COLONOSCOPY  2013   CYSTOSCOPY/URETEROSCOPY/HOLMIUM LASER/STENT PLACEMENT Bilateral 05/02/2018   Procedure: CYSTOSCOPY/URETEROSCOPY/RIGHT RETROGRADE/BILATERAL STENT REMOVAL/STENT PLACEMENT LEFT URETER;  Surgeon: Jerilee Field, MD;  Location: [facility];  Service: Urology;  Laterality: Bilateral;   EXTRACORPOREAL SHOCK WAVE LITHOTRIPSY Left 04/18/2017   Procedure: LEFT EXTRACORPOREAL SHOCK WAVE LITHOTRIPSY (ESWL);  Surgeon: Bjorn Pippin, MD;  Location: [facility];  Service: Urology;  Laterality: Left;   EXTRACORPOREAL SHOCK WAVE LITHOTRIPSY Right 04/07/2018   Procedure: RIGHT EXTRACORPOREAL SHOCK WAVE LITHOTRIPSY (ESWL);  Surgeon: Crista Elliot,  MD;  Location: [facility];  Service: Urology;  Laterality: Right;   EYE SURGERY Bilateral    CATARACTS   URETEROSCOPY  2019   with stents insertion    Prior to Admission medications   Medication Sig Start Date End Date Taking? Authorizing Provider  amLODipine (NORVASC) 2.5 MG tablet Take 2.5 mg by mouth daily.   Yes [provider]  aspirin EC 81 MG tablet Take 81 mg by mouth daily.   Yes [provider]  buPROPion (WELLBUTRIN SR) 200 MG 12 hr tablet Take 200 mg by mouth 2 (two) times daily. 05/03/22  Yes [provider]  gabapentin (NEURONTIN) 300 MG capsule Take 300 mg by mouth daily.   Yes [provider]  glipiZIDE (GLUCOTROL XL) 2.5 MG 24 hr tablet TAKE 1 TABLET BY MOUTH ONCE DAILY IN THE MORNING WITH BREAKFAST 10/22/18  Yes [provider]  omeprazole (PRILOSEC) 20 MG capsule Take 1 capsule (20 mg total) by mouth daily. Take one capsule 30-60 minutes before dinner. 12/14/22  Yes Unk Lightning, PA  TRESIBA FLEXTOUCH 100 UNIT/ML FlexTouch Pen  09/12/20  Yes [provider]  acetaminophen (TYLENOL) 500 MG tablet Take 500 mg by mouth.    [provider]  ALPRAZolam Prudy Feeler) 0.5 MG tablet Take 1-2 tablets (0.5-1 mg total) by mouth daily as needed for anxiety. 12/31/22   Melony Overly T, PA-C  bismuth subsalicylate (PEPTO BISMOL) 262 MG chewable tablet Chew 524 mg by mouth as needed.    [provider]  famotidine (PEPCID) 20 MG tablet  Take 1 tablet (20 mg total) by mouth 2 (two) times daily. 10/19/21   Stoneking, Ann Maki, MD  fluticasone (FLONASE) 50 MCG/ACT nasal spray Place into both nostrils as needed for allergies or rhinitis.    [provider]  hydrocortisone 2.5 % cream Apply topically as needed.    [provider]  loratadine (CLARITIN) 10 MG tablet Take 10 mg by mouth daily as needed for allergies.    [provider]  Multiple Vitamins-Minerals (EMERGEN-C IMMUNE PO) Take by mouth as needed.     [provider]  sildenafil (VIAGRA) 100 MG tablet Take 100 mg by mouth as needed for erectile dysfunction.    [provider]    Current Outpatient Medications  Medication Sig Dispense Refill   amLODipine (NORVASC) 2.5 MG tablet Take 2.5 mg by mouth daily.     aspirin EC 81 MG tablet Take 81 mg by mouth daily.     buPROPion (WELLBUTRIN SR) 200 MG 12 hr tablet Take 200 mg by mouth 2 (two) times daily.     gabapentin (NEURONTIN) 300 MG capsule Take 300 mg by mouth daily.     glipiZIDE (GLUCOTROL XL) 2.5 MG 24 hr tablet TAKE 1 TABLET BY MOUTH ONCE DAILY IN THE MORNING WITH BREAKFAST     omeprazole (PRILOSEC) 20 MG capsule Take 1 capsule (20 mg total) by mouth daily. Take one capsule 30-60 minutes before dinner. 90 capsule 3   TRESIBA FLEXTOUCH 100 UNIT/ML FlexTouch Pen      acetaminophen (TYLENOL) 500 MG tablet Take 500 mg by mouth.     ALPRAZolam (XANAX) 0.5 MG tablet Take 1-2 tablets (0.5-1 mg total) by mouth daily as needed for anxiety. 60 tablet 2   bismuth subsalicylate (PEPTO BISMOL) 262 MG chewable tablet Chew 524 mg by mouth as needed.     famotidine (PEPCID) 20 MG tablet Take 1 tablet (20 mg total) by mouth 2 (two) times daily. 180 tablet 3   fluticasone (FLONASE) 50 MCG/ACT nasal spray Place into both nostrils as needed for allergies or rhinitis.     hydrocortisone 2.5 % cream Apply topically as needed.     loratadine (CLARITIN) 10 MG tablet Take 10 mg by mouth daily as needed for allergies.     Multiple Vitamins-Minerals (EMERGEN-C IMMUNE PO) Take by mouth as needed.     sildenafil (VIAGRA) 100 MG tablet Take 100 mg by mouth as needed for erectile dysfunction.     Current Facility-Administered Medications  Medication Dose Route Frequency Provider Last Rate Last Admin   0.9 %  sodium chloride infusion  500 mL Intravenous Once Yacqub Baston, Carie Caddy, MD        Allergies as of 02/04/2023 - Review Complete 02/04/2023  Allergen Reaction Noted   Auvelity  [dextromethorphan-bupropion er] Other (See Comments) 06/28/2022   Flomax [tamsulosin hcl] Other (See Comments) 06/20/2020   Ozempic (0.25 or 0.5 mg-dose) [semaglutide(0.25 or 0.5mg -dos)] Other (See Comments) 06/20/2020   Trulicity [dulaglutide] Other (See Comments) 06/20/2020   Victoza [liraglutide] Other (See Comments) 06/20/2020    Family History  Problem Relation Age of Onset   Alzheimer's disease Mother    Kidney disease Father    Diabetes Brother    Colon cancer Neg Hx    Rectal cancer Neg Hx    Stomach cancer Neg Hx    Esophageal cancer Neg Hx    Pancreatic cancer Neg Hx    Liver cancer Neg Hx     Social History   Socioeconomic History   Marital  status: Single    Spouse name: Not on file   Number of children: 6   Years of education: Not on file   Highest education level: Not on file  Occupational History   Not on file  Tobacco Use   Smoking status: Never   Smokeless tobacco: Never  Vaping Use   Vaping Use: Never used  Substance and Sexual Activity   Alcohol use: No   Drug use: No   Sexual activity: Not on file  Other Topics Concern   Not on file  Social History Narrative   Not on file   Social Determinants of Health   Financial Resource Strain: Not on file  Food Insecurity: Not on file  Transportation Needs: Not on file  Physical Activity: Not on file  Stress: Not on file  Social Connections: Not on file  Intimate Partner Violence: Not on file    Physical Exam: Vital signs in last 24 hours: @BP  132/84   Pulse 91   Temp 99.1 F (37.3 C) (Temporal)   Resp (!) 7   Ht 5' 6.75" (1.695 m)   Wt 172 lb (78 kg)   SpO2 100%   BMI 27.14 kg/m  GEN: NAD EYE: Sclerae anicteric ENT: MMM CV: Non-tachycardic Pulm: CTA b/l GI: Soft, NT/ND NEURO:  Alert & Oriented x 3   Erick Blinks, MD [facility]  02/04/2023 1:48 PM

## 2023-02-04 NOTE — Patient Instructions (Signed)
Discharge instructions given. Handouts on polyps,diverticulosis,hemorrhoids and Hiatal Hernia. Resume previous medications. YOU HAD AN ENDOSCOPIC PROCEDURE TODAY AT THE [facility]:   Refer to the procedure report that was given to you for any specific questions about what was found during the examination.  If the procedure report does not answer your questions, please call your gastroenterologist to clarify.  If you requested that your care partner not be given the details of your procedure findings, then the procedure report has been included in a sealed envelope for you to review at your convenience later.  YOU SHOULD EXPECT: Some feelings of bloating in the abdomen. Passage of more gas than usual.  Walking can help get rid of the air that was put into your GI tract during the procedure and reduce the bloating. If you had a lower endoscopy (such as a colonoscopy or flexible sigmoidoscopy) you may notice spotting of blood in your stool or on the toilet paper. If you underwent a bowel prep for your procedure, you may not have a normal bowel movement for a few days.  Please Note:  You might notice some irritation and congestion in your nose or some drainage.  This is from the oxygen used during your procedure.  There is no need for concern and it should clear up in a day or so.  SYMPTOMS TO REPORT IMMEDIATELY:  Following lower endoscopy (colonoscopy or flexible sigmoidoscopy):  Excessive amounts of blood in the stool  Significant tenderness or worsening of abdominal pains  Swelling of the abdomen that is new, acute  Fever of 100F or higher  Following upper endoscopy (EGD)  Vomiting of blood or coffee ground material  New chest pain or pain under the shoulder blades  Painful or persistently difficult swallowing  New shortness of breath  Fever of 100F or higher  Black, tarry-looking stools  For urgent or emergent issues, a gastroenterologist can be reached at any hour by calling  [phone]. Do not use MyChart messaging for urgent concerns.    DIET:  We do recommend a small meal at first, but then you may proceed to your regular diet.  Drink plenty of fluids but you should avoid alcoholic beverages for 24 hours.  ACTIVITY:  You should plan to take it easy for the rest of today and you should NOT DRIVE or use heavy machinery until tomorrow (because of the sedation medicines used during the test).    FOLLOW UP: Our staff will call the number listed on your records the next business day following your procedure.  We will call around 7:15- 8:00 am to check on you and address any questions or concerns that you may have regarding the information given to you following your procedure. If we do not reach you, we will leave a message.     If any biopsies were taken you will be contacted by phone or by letter within the next 1-3 weeks.  Please call us at [phone] if you have not heard about the biopsies in 3 weeks.    SIGNATURES/CONFIDENTIALITY: You and/or your care partner have signed paperwork which will be entered into your electronic medical record.  These signatures attest to the fact that that the information above on your After Visit Summary has been reviewed and is understood.  Full responsibility of the confidentiality of this discharge information lies with you and/or your care-partner.

## 2023-02-04 NOTE — Op Note (Signed)
[facility] Patient Name: Blake Young Procedure Date: 02/04/2023 1:42 PM MRN: [id number] Endoscopist: Beverley Fiedler , MD, [id number] Age: 72 Referring MD:  Date of Birth: 10-27-1950 Gender: Male Account #: [id number] Procedure:                Colonoscopy Indications:              Screening for colorectal malignant neoplasm, Last                            colonoscopy: 2011 (at [facility]) Medicines:                Monitored Anesthesia Care Procedure:                Pre-Anesthesia Assessment:                           - Prior to the procedure, a History and Physical                            was performed, and patient medications and                            allergies were reviewed. The patient's tolerance of                            previous anesthesia was also reviewed. The risks                            and benefits of the procedure and the sedation                            options and risks were discussed with the patient.                            All questions were answered, and informed consent                            was obtained. Prior Anticoagulants: The patient has                            taken no anticoagulant or antiplatelet agents. ASA                            Grade Assessment: III - A patient with severe                            systemic disease. After reviewing the risks and                            benefits, the patient was deemed in satisfactory                            condition to undergo the procedure.  After obtaining informed consent, the colonoscope                            was passed under direct vision. Throughout the                            procedure, the patient's blood pressure, pulse, and                            oxygen saturations were monitored continuously. The                            Olympus SN [id number] was introduced through the anus                            and advanced to the cecum,  identified by                            appendiceal orifice and ileocecal valve. The                            colonoscopy was performed without difficulty. The                            patient tolerated the procedure well. The quality                            of the bowel preparation was excellent. The                            ileocecal valve, appendiceal orifice, and rectum                            were photographed. Scope In: 1:57:25 PM Scope Out: 2:15:24 PM Scope Withdrawal Time: 0 hours 14 minutes 7 seconds  Total Procedure Duration: 0 hours 17 minutes 59 seconds  Findings:                 The digital rectal exam was normal.                           Three sessile polyps were found in the transverse                            colon. The polyps were 4 to 6 mm in size. These                            polyps were removed with a cold snare. Resection                            and retrieval were complete.                           A 5 mm polyp was found in the descending colon. The  polyp was sessile. The polyp was removed with a                            cold snare. Resection and retrieval were complete.                           A 5 mm polyp was found in the sigmoid colon. The                            polyp was sessile. The polyp was removed with a                            cold snare. Resection and retrieval were complete.                           A 7 mm polyp was found in the rectum. The polyp was                            sessile. The polyp was removed with a cold snare.                            Resection and retrieval were complete.                           Multiple medium-mouthed and small-mouthed                            diverticula were found in the sigmoid colon.                           Internal hemorrhoids were found during                            retroflexion. The hemorrhoids were small. Complications:            No  immediate complications. Estimated Blood Loss:     Estimated blood loss was minimal. Impression:               - Three 4 to 6 mm polyps in the transverse colon,                            removed with a cold snare. Resected and retrieved.                           - One 5 mm polyp in the descending colon, removed                            with a cold snare. Resected and retrieved.                           - One 5 mm polyp in the sigmoid colon, removed with  a cold snare. Resected and retrieved.                           - One 7 mm polyp in the rectum, removed with a cold                            snare. Resected and retrieved.                           - Mild diverticulosis in the sigmoid colon.                           - Internal hemorrhoids. Recommendation:           - Patient has a contact number available for                            emergencies. The signs and symptoms of potential                            delayed complications were discussed with the                            patient. Return to normal activities tomorrow.                            Written discharge instructions were provided to the                            patient.                           - Resume previous diet.                           - Continue present medications.                           - Await pathology results.                           - Repeat colonoscopy is recommended for                            surveillance. The colonoscopy date will be                            determined after pathology results from today's                            exam become available for review. Beverley Fiedler, MD 02/04/2023 2:24:20 PM This report has been signed electronically.

## 2023-02-05 ENCOUNTER — Telehealth: Payer: Self-pay

## 2023-02-05 NOTE — Telephone Encounter (Signed)
  Follow up Call-     02/04/2023    1:12 PM  Call back number  Post procedure Call Back phone  # [phone]  Permission to leave phone message Yes     Patient questions:  Do you have a fever, pain , or abdominal swelling? Yes.   Pain Score  2 *  Have you tolerated food without any problems? Yes.    Have you been able to return to your normal activities? Yes.    Do you have any questions about your discharge instructions: Diet   No. Medications  No. Follow up visit  No.  Do you have questions or concerns about your Care? No.  Actions: * If pain score is 4 or above: No action needed, pain <4.   States he has had some abd pain and cramping yesterday, states better today, some blood when he wipes but nothing excessive.  Instructed pt to call if any pain worsens, or if bleeding worsens, pt verb understanding.

## 2023-02-08 DIAGNOSIS — R3915 Urgency of urination: Secondary | ICD-10-CM | POA: Diagnosis not present

## 2023-02-08 DIAGNOSIS — N5201 Erectile dysfunction due to arterial insufficiency: Secondary | ICD-10-CM | POA: Diagnosis not present

## 2023-02-08 DIAGNOSIS — N200 Calculus of kidney: Secondary | ICD-10-CM | POA: Diagnosis not present

## 2023-02-08 DIAGNOSIS — N401 Enlarged prostate with lower urinary tract symptoms: Secondary | ICD-10-CM | POA: Diagnosis not present

## 2023-02-11 ENCOUNTER — Encounter: Payer: Self-pay | Admitting: Internal Medicine

## 2023-02-28 DIAGNOSIS — L538 Other specified erythematous conditions: Secondary | ICD-10-CM | POA: Diagnosis not present

## 2023-02-28 DIAGNOSIS — Z872 Personal history of diseases of the skin and subcutaneous tissue: Secondary | ICD-10-CM | POA: Diagnosis not present

## 2023-02-28 DIAGNOSIS — D485 Neoplasm of uncertain behavior of skin: Secondary | ICD-10-CM | POA: Diagnosis not present

## 2023-02-28 DIAGNOSIS — B079 Viral wart, unspecified: Secondary | ICD-10-CM | POA: Diagnosis not present

## 2023-02-28 DIAGNOSIS — L738 Other specified follicular disorders: Secondary | ICD-10-CM | POA: Diagnosis not present

## 2023-02-28 DIAGNOSIS — L814 Other melanin hyperpigmentation: Secondary | ICD-10-CM | POA: Diagnosis not present

## 2023-02-28 DIAGNOSIS — D225 Melanocytic nevi of trunk: Secondary | ICD-10-CM | POA: Diagnosis not present

## 2023-02-28 DIAGNOSIS — L821 Other seborrheic keratosis: Secondary | ICD-10-CM | POA: Diagnosis not present

## 2023-05-14 ENCOUNTER — Other Ambulatory Visit: Payer: Self-pay | Admitting: Internal Medicine

## 2023-05-14 ENCOUNTER — Ambulatory Visit
Admission: RE | Admit: 2023-05-14 | Discharge: 2023-05-14 | Disposition: A | Discharge status: Home / Self Care | Payer: PPO | Source: Ambulatory Visit | Attending: Internal Medicine | Admitting: Internal Medicine

## 2023-05-14 DIAGNOSIS — R058 Other specified cough: Secondary | ICD-10-CM | POA: Diagnosis not present

## 2023-05-14 DIAGNOSIS — M6208 Separation of muscle (nontraumatic), other site: Secondary | ICD-10-CM | POA: Diagnosis not present

## 2023-06-06 DIAGNOSIS — Z0000 Encounter for general adult medical examination without abnormal findings: Secondary | ICD-10-CM | POA: Diagnosis not present

## 2023-06-06 DIAGNOSIS — K219 Gastro-esophageal reflux disease without esophagitis: Secondary | ICD-10-CM | POA: Diagnosis not present

## 2023-06-06 DIAGNOSIS — F331 Major depressive disorder, recurrent, moderate: Secondary | ICD-10-CM | POA: Diagnosis not present

## 2023-06-06 DIAGNOSIS — M5030 Other cervical disc degeneration, unspecified cervical region: Secondary | ICD-10-CM | POA: Diagnosis not present

## 2023-06-06 DIAGNOSIS — Z79899 Other long term (current) drug therapy: Secondary | ICD-10-CM | POA: Diagnosis not present

## 2023-06-06 DIAGNOSIS — N1832 Chronic kidney disease, stage 3b: Secondary | ICD-10-CM | POA: Diagnosis not present

## 2023-06-06 DIAGNOSIS — G4733 Obstructive sleep apnea (adult) (pediatric): Secondary | ICD-10-CM | POA: Diagnosis not present

## 2023-06-06 DIAGNOSIS — Z1331 Encounter for screening for depression: Secondary | ICD-10-CM | POA: Diagnosis not present

## 2023-06-06 DIAGNOSIS — E7800 Pure hypercholesterolemia, unspecified: Secondary | ICD-10-CM | POA: Diagnosis not present

## 2023-06-06 DIAGNOSIS — Z23 Encounter for immunization: Secondary | ICD-10-CM | POA: Diagnosis not present

## 2023-06-11 DIAGNOSIS — L905 Scar conditions and fibrosis of skin: Secondary | ICD-10-CM | POA: Diagnosis not present

## 2023-06-11 DIAGNOSIS — Z872 Personal history of diseases of the skin and subcutaneous tissue: Secondary | ICD-10-CM | POA: Diagnosis not present

## 2023-06-11 DIAGNOSIS — L821 Other seborrheic keratosis: Secondary | ICD-10-CM | POA: Diagnosis not present

## 2023-06-11 DIAGNOSIS — L218 Other seborrheic dermatitis: Secondary | ICD-10-CM | POA: Diagnosis not present

## 2023-06-11 DIAGNOSIS — L738 Other specified follicular disorders: Secondary | ICD-10-CM | POA: Diagnosis not present

## 2023-06-13 NOTE — Therapy (Signed)
OUTPATIENT PHYSICAL THERAPY MALE PELVIC EVALUATION   Patient Name: Blake Young. MRN: [id number] DOB:1951-02-05, 72 y.o., male Today's Date: 06/14/2023  END OF SESSION:  PT End of Session - 06/14/23 1141     Visit Number 1    Date for PT Re-Evaluation 08/09/23    PT Start Time 1020    PT Stop Time 1105    PT Time Calculation (min) 45 min    Activity Tolerance Patient tolerated treatment well;No increased pain    Behavior During Therapy WFL for tasks assessed/performed             Past Medical History:  Diagnosis Date   Arthritis    Depression    Diabetes mellitus without complication (HCC)    GERD (gastroesophageal reflux disease)    Herniated cervical disc    History of kidney stones    Hyperlipidemia    Hypertension    Neuromuscular disorder (HCC)    Sleep apnea    does not use CPAP    Past Surgical History:  Procedure Laterality Date   BASAL CELL CARCINOMA EXCISION  2016   UPPER RIGHT CHEST   COLONOSCOPY  2013   CYSTOSCOPY/URETEROSCOPY/HOLMIUM LASER/STENT PLACEMENT Bilateral 05/02/2018   Procedure: CYSTOSCOPY/URETEROSCOPY/RIGHT RETROGRADE/BILATERAL STENT REMOVAL/STENT PLACEMENT LEFT URETER;  Surgeon: Jerilee Field, MD;  Location: [facility];  Service: Urology;  Laterality: Bilateral;   EXTRACORPOREAL SHOCK WAVE LITHOTRIPSY Left 04/18/2017   Procedure: LEFT EXTRACORPOREAL SHOCK WAVE LITHOTRIPSY (ESWL);  Surgeon: Bjorn Pippin, MD;  Location: [facility];  Service: Urology;  Laterality: Left;   EXTRACORPOREAL SHOCK WAVE LITHOTRIPSY Right 04/07/2018   Procedure: RIGHT EXTRACORPOREAL SHOCK WAVE LITHOTRIPSY (ESWL);  Surgeon: Crista Elliot, MD;  Location: [facility];  Service: Urology;  Laterality: Right;   EYE SURGERY Bilateral    CATARACTS   URETEROSCOPY  2019   with stents insertion   Patient Active Problem List   Diagnosis Date Noted   Chronic kidney disease 08/05/2015   Basal cell carcinoma of chest 06/16/2015   Gastroesophageal reflux  disease without esophagitis 04/26/2015   Right elbow pain 04/26/2015   Skin tag 04/26/2015   Urge incontinence of urine 07/19/2014   Cornea guttata 11/12/2013   No diabetic retinopathy in both eyes 11/12/2013   Pseudophakia of both eyes 11/12/2013   Seborrheic blepharitis 11/12/2013   Vitreous liquefaction 11/12/2013   Brachial neuritis 05/29/2013   Erectile dysfunction 05/29/2013   Insomnia 05/29/2013   Obstructive sleep apnea 07/29/2012   Anterior corneal dystrophy 06/13/2012   Borderline glaucoma, open angle with borderline findings 06/13/2012   Lens replaced 06/13/2012   Regular astigmatism 06/13/2012   Type II diabetes mellitus (HCC) 06/13/2012   Neck pain 06/09/2012   Screen for colon cancer 09/25/2011   Hyperlipidemia 03/28/2011   Hypertension, benign 03/28/2011   Major depressive disorder, recurrent episode (HCC) 03/28/2011    PCP: Thana Ates, MD PCP - General  REFERRING PROVIDER: Thana Ates, MD Ref Provider (PCP)  REFERRING DIAG: M62.08 (ICD-10-CM) - Separation of muscle (nontraumatic), other site  THERAPY DIAG:  Muscle weakness (generalized)  Unspecified lack of coordination  Rationale for Evaluation and Treatment: Rehabilitation  ONSET DATE: 01/2023  SUBJECTIVE:  SUBJECTIVE STATEMENT: Pt reports that he noticed his diastasis rectus abdominis about 5 months ago. No hx of abdominal surgeries. Reports that he has a hernia. He reports that he looked at some exercises, but is not sure if they are right. He does not have any low back pain. Reports that he is retired, he walks about 3 miles.  He has some herniated discs in his neck and does not want to aggravate the neck. He does not have arm pain and neck pain. Takes Gabapentin. Feels like the neck hurts, and when he focuses on  it, it hurts. It hurts all the time but he is used to it. Aggravating- stress. Has not done any abdominal strengthening recently.   PAIN:  Are you having pain? Yes, some neck pain NPRS scale: 4/10  PRECAUTIONS: None  RED FLAGS: None   WEIGHT BEARING RESTRICTIONS: No  FALLS:  Has patient fallen in last 6 months? No  LIVING ENVIRONMENT: Lives with: lives alone Lives in: House/apartment Stairs: No Has following equipment at home: None  OCCUPATION: retired, has kids all over the country, just came from visiting one for 3 months  PLOF: Independent  PATIENT GOALS: to not worsen his diastasis and learn the right exercises so he does not aggravate his neck  PERTINENT HISTORY:   See above  OBJECTIVE:  Note: Objective measures were completed at Evaluation unless otherwise noted.  COGNITION: Overall cognitive status: Within functional limits for tasks assessed     SENSATION: Light touch: Appears intact Proprioception: Appears intact  POSTURE: rounded shoulders and forward head Limited cervical AROM- left rotation 42 degrees, right rotation 44 degrees Side bending- 5 degrees Weak bilat upper extremities/ shoulders- abduction and elbow flexion  LOWER EXTREMITY MMT: Hip tightness throughout bilateral  PALPATION: GENERAL : doming of DRA with pretty firm feel  Breath holding strategies with resistance and transfer              TONE: Separation of bilateral rectus abdominis noticed- assessed 2 cm below and above umbilicus and at umbilicus  TODAY'S TREATMENT:                                                                                                                              DATE: 06/14/23   EVAL see above   PATIENT EDUCATION:   Education details/ ther act: pressure management, exercises to activate transverse abdominis with transverse abdominis breath- ball press with exhale, horizontal abduction with thera band with pursed lip exhale- weakness in bilat  shoulders- difficult Person educated: Patient Education method: Explanation, Tactile cues, and Verbal cues Education comprehension: verbalized understanding and needs further education  HOME EXERCISE PROGRAM:  [id number]  ASSESSMENT:  CLINICAL IMPRESSION: Patient is a 72 y.o. M who was seen today for physical therapy evaluation and treatment for diastasis rectus abdominis. He has likely been using breath holding strategies to compensate for chronic neck pain and bilateral arm weakness and has worsened the laxity of line  alba. He presents with abdominal doming and breath holding with transfers, about 2 finger gap throughout DRA, very limited C spine AROM and guarded movement. Pt seems afraid to move as well. He will benefit from PT to address deficits.   OBJECTIVE IMPAIRMENTS: decreased activity tolerance, decreased coordination, decreased endurance, decreased knowledge of condition, decreased mobility, decreased ROM, decreased strength, increased fascial restrictions, impaired tone, improper body mechanics, and pain.   ACTIVITY LIMITATIONS: carrying, lifting, bending, transfers, and caring for others  PARTICIPATION LIMITATIONS: cleaning, driving, community activity, and yard work  PERSONAL FACTORS: Time since onset of injury/illness/exacerbation and 3+ comorbidities:    are also affecting patient's functional outcome.   REHAB POTENTIAL: Good  CLINICAL DECISION MAKING: Evolving/moderate complexity  EVALUATION COMPLEXITY: Moderate   GOALS: Goals reviewed with patient? Yes  SHORT TERM GOALS: Target date: 07/12/2023    Pt will be independent with HEP.   Baseline: Goal status: INITIAL  2.  Pt will demonstrate improved pressure management with exercises and transfers to reduce abdominal doming and engage muscles correctly to improve core stability Baseline:  Goal status: INITIAL  3.  Pt will report max 2/10 neck pain with his exercises Baseline:  Goal status: INITIAL  LONG TERM  GOALS: Target date: 08/09/2023    Pt will be I with advanced HEP without neck pain  Baseline:  Goal status: INITIAL  2.  Pt will demonstrated improved abdominal strength to at least 4/5 and activation to help with ease of transfers and mobility Baseline:  Goal status: INITIAL     PLAN:  PT FREQUENCY: 1-2x/week  PT DURATION:  8 sessions  PLANNED INTERVENTIONS: 97146- PT Re-evaluation, 97110-Therapeutic exercises, 97530- Therapeutic activity, 97112- Neuromuscular re-education, 97535- Self Care, 16109- Manual therapy, Patient/Family education, Taping, Dry Needling, Joint mobilization, Joint manipulation, Scar mobilization, Moist heat, and Biofeedback  PLAN FOR NEXT SESSION: abdominal and low back MFR and cont strengthening and coordination   Elaine Roanhorse, PT 06/14/2023, 11:43 AM

## 2023-06-14 ENCOUNTER — Other Ambulatory Visit: Payer: Self-pay

## 2023-06-14 ENCOUNTER — Ambulatory Visit: Payer: PPO | Attending: Internal Medicine | Admitting: Physical Therapy

## 2023-06-14 ENCOUNTER — Encounter: Payer: Self-pay | Admitting: Physical Therapy

## 2023-06-14 DIAGNOSIS — M6208 Separation of muscle (nontraumatic), other site: Secondary | ICD-10-CM | POA: Insufficient documentation

## 2023-06-14 DIAGNOSIS — R279 Unspecified lack of coordination: Secondary | ICD-10-CM | POA: Insufficient documentation

## 2023-06-14 DIAGNOSIS — M6281 Muscle weakness (generalized): Secondary | ICD-10-CM | POA: Insufficient documentation

## 2023-06-17 DIAGNOSIS — Z79899 Other long term (current) drug therapy: Secondary | ICD-10-CM | POA: Diagnosis not present

## 2023-06-17 DIAGNOSIS — R809 Proteinuria, unspecified: Secondary | ICD-10-CM | POA: Diagnosis not present

## 2023-06-17 DIAGNOSIS — E1165 Type 2 diabetes mellitus with hyperglycemia: Secondary | ICD-10-CM | POA: Diagnosis not present

## 2023-06-17 DIAGNOSIS — I10 Essential (primary) hypertension: Secondary | ICD-10-CM | POA: Diagnosis not present

## 2023-06-17 DIAGNOSIS — Z125 Encounter for screening for malignant neoplasm of prostate: Secondary | ICD-10-CM | POA: Diagnosis not present

## 2023-06-17 DIAGNOSIS — N1832 Chronic kidney disease, stage 3b: Secondary | ICD-10-CM | POA: Diagnosis not present

## 2023-06-17 DIAGNOSIS — E7800 Pure hypercholesterolemia, unspecified: Secondary | ICD-10-CM | POA: Diagnosis not present

## 2023-06-20 ENCOUNTER — Ambulatory Visit: Payer: PPO | Admitting: Physical Therapy

## 2023-06-20 DIAGNOSIS — R279 Unspecified lack of coordination: Secondary | ICD-10-CM

## 2023-06-20 DIAGNOSIS — M6281 Muscle weakness (generalized): Secondary | ICD-10-CM

## 2023-06-20 NOTE — Therapy (Signed)
OUTPATIENT PHYSICAL THERAPY MALE PELVIC EVALUATION   Patient Name: Blake Young. MRN: [id number] DOB:1951/03/05, 72 y.o., male Today's Date: 06/20/2023  END OF SESSION:  PT End of Session - 06/20/23 1412     Visit Number 2    Date for PT Re-Evaluation 08/09/23    PT Start Time 1225    PT Stop Time 1315    PT Time Calculation (min) 50 min    Activity Tolerance Patient tolerated treatment well;No increased pain    Behavior During Therapy WFL for tasks assessed/performed              Past Medical History:  Diagnosis Date   Arthritis    Depression    Diabetes mellitus without complication (HCC)    GERD (gastroesophageal reflux disease)    Herniated cervical disc    History of kidney stones    Hyperlipidemia    Hypertension    Neuromuscular disorder (HCC)    Sleep apnea    does not use CPAP    Past Surgical History:  Procedure Laterality Date   BASAL CELL CARCINOMA EXCISION  2016   UPPER RIGHT CHEST   COLONOSCOPY  2013   CYSTOSCOPY/URETEROSCOPY/HOLMIUM LASER/STENT PLACEMENT Bilateral 05/02/2018   Procedure: CYSTOSCOPY/URETEROSCOPY/RIGHT RETROGRADE/BILATERAL STENT REMOVAL/STENT PLACEMENT LEFT URETER;  Surgeon: Jerilee Field, MD;  Location: [facility];  Service: Urology;  Laterality: Bilateral;   EXTRACORPOREAL SHOCK WAVE LITHOTRIPSY Left 04/18/2017   Procedure: LEFT EXTRACORPOREAL SHOCK WAVE LITHOTRIPSY (ESWL);  Surgeon: Bjorn Pippin, MD;  Location: [facility];  Service: Urology;  Laterality: Left;   EXTRACORPOREAL SHOCK WAVE LITHOTRIPSY Right 04/07/2018   Procedure: RIGHT EXTRACORPOREAL SHOCK WAVE LITHOTRIPSY (ESWL);  Surgeon: Crista Elliot, MD;  Location: [facility];  Service: Urology;  Laterality: Right;   EYE SURGERY Bilateral    CATARACTS   URETEROSCOPY  2019   with stents insertion   Patient Active Problem List   Diagnosis Date Noted   Chronic kidney disease 08/05/2015   Basal cell carcinoma of chest 06/16/2015   Gastroesophageal reflux  disease without esophagitis 04/26/2015   Right elbow pain 04/26/2015   Skin tag 04/26/2015   Urge incontinence of urine 07/19/2014   Cornea guttata 11/12/2013   No diabetic retinopathy in both eyes 11/12/2013   Pseudophakia of both eyes 11/12/2013   Seborrheic blepharitis 11/12/2013   Vitreous liquefaction 11/12/2013   Brachial neuritis 05/29/2013   Erectile dysfunction 05/29/2013   Insomnia 05/29/2013   Obstructive sleep apnea 07/29/2012   Anterior corneal dystrophy 06/13/2012   Borderline glaucoma, open angle with borderline findings 06/13/2012   Lens replaced 06/13/2012   Regular astigmatism 06/13/2012   Type II diabetes mellitus (HCC) 06/13/2012   Neck pain 06/09/2012   Screen for colon cancer 09/25/2011   Hyperlipidemia 03/28/2011   Hypertension, benign 03/28/2011   Major depressive disorder, recurrent episode (HCC) 03/28/2011    PCP: Thana Ates, MD PCP - General  REFERRING PROVIDER: Thana Ates, MD Ref Provider (PCP)  REFERRING DIAG: M62.08 (ICD-10-CM) - Separation of muscle (nontraumatic), other site  THERAPY DIAG:  Muscle weakness (generalized)  Unspecified lack of coordination  Rationale for Evaluation and Treatment: Rehabilitation  ONSET DATE: 01/2023  SUBJECTIVE:  SUBJECTIVE STATEMENT: Pt reports that he tried the exercises, but his arms got very tired and he is not sure if her is breathing right with the exercises.     Pt reports that he noticed his diastasis rectus abdominis about 5 months ago. No hx of abdominal surgeries. Reports that he has a hernia. He reports that he looked at some exercises, but is not sure if they are right. He does not have any low back pain. Reports that he is retired, he walks about 3 miles.  He has some herniated discs in his neck and does  not want to aggravate the neck. He does not have arm pain and neck pain. Takes Gabapentin. Feels like the neck hurts, and when he focuses on it, it hurts. It hurts all the time but he is used to it. Aggravating- stress. Has not done any abdominal strengthening recently.   PAIN:  Are you having pain? Yes, some neck pain NPRS scale: 4/10  PRECAUTIONS: None  RED FLAGS: None   WEIGHT BEARING RESTRICTIONS: No  FALLS:  Has patient fallen in last 6 months? No  LIVING ENVIRONMENT: Lives with: lives alone Lives in: House/apartment Stairs: No Has following equipment at home: None  OCCUPATION: retired, has kids all over the country, just came from visiting one for 3 months  PLOF: Independent  PATIENT GOALS: to not worsen his diastasis and learn the right exercises so he does not aggravate his neck  PERTINENT HISTORY:   See above  OBJECTIVE:  Note: Objective measures were completed at Evaluation unless otherwise noted.  COGNITION: Overall cognitive status: Within functional limits for tasks assessed     SENSATION: Light touch: Appears intact Proprioception: Appears intact  POSTURE: rounded shoulders and forward head Limited cervical AROM- left rotation 42 degrees, right rotation 44 degrees Side bending- 5 degrees Weak bilat upper extremities/ shoulders- abduction and elbow flexion  LOWER EXTREMITY MMT: Hip tightness throughout bilateral  PALPATION: GENERAL : doming of DRA with pretty firm feel  Breath holding strategies with resistance and transfer              TONE: Separation of bilateral rectus abdominis noticed- assessed 2 cm below and above umbilicus and at umbilicus  TODAY'S TREATMENT:                                                                                                                              DATE: 06/20/23      Therapeutic exercises- supine to sit with TRANSVERSE ABDOMINIS BREATH to reduce doming Bicep curls #10 with TRANSVERSE ABDOMINIS  BREATH  Rowing #10 with TRANSVERSE ABDOMINIS BREATH  Horizontal abduction with Thera band ( yellow) standing with mirror feedback Bridging with TRANSVERSE ABDOMINIS BREATH  Wall pushups with TRANSVERSE ABDOMINIS BREATH          PATIENT EDUCATION:   Education details/ ther act: pressure management, exercises to activate transverse abdominis with transverse abdominis breath- ball press with exhale, horizontal abduction  with thera band with pursed lip exhale- weakness in bilat shoulders- difficult Person educated: Patient Education method: Explanation, Tactile cues, and Verbal cues Education comprehension: verbalized understanding and needs further education  HOME EXERCISE PROGRAM:  [id number]  ASSESSMENT:  CLINICAL IMPRESSION:  Pt did fairly well with his exercises, he has bilateral shoulder and arm weakness that is contributing to his core  weakness and breath holding. He reported that he has a hx of herniated cervical disc since his 30s and has been afraid to exercises and hurt his neck for 40 years. Discussed being sore is OK and progressing with HEP slowly but well to strengthen upper body to reduce breath holding and worsening his diastasis.        Patient is a 72 y.o. M who was seen today for physical therapy evaluation and treatment for diastasis rectus abdominis. He has likely been using breath holding strategies to compensate for chronic neck pain and bilateral arm weakness and has worsened the laxity of line alba. He presents with abdominal doming and breath holding with transfers, about 2 finger gap throughout DRA, very limited C spine AROM and guarded movement. Pt seems afraid to move as well. He will benefit from PT to address deficits.   OBJECTIVE IMPAIRMENTS: decreased activity tolerance, decreased coordination, decreased endurance, decreased knowledge of condition, decreased mobility, decreased ROM, decreased strength, increased fascial restrictions, impaired tone,  improper body mechanics, and pain.   ACTIVITY LIMITATIONS: carrying, lifting, bending, transfers, and caring for others  PARTICIPATION LIMITATIONS: cleaning, driving, community activity, and yard work  PERSONAL FACTORS: Time since onset of injury/illness/exacerbation and 3+ comorbidities:    are also affecting patient's functional outcome.   REHAB POTENTIAL: Good  CLINICAL DECISION MAKING: Evolving/moderate complexity  EVALUATION COMPLEXITY: Moderate   GOALS: Goals reviewed with patient? Yes  SHORT TERM GOALS: Target date: 07/12/2023    Pt will be independent with HEP.   Baseline: Goal status: INITIAL  2.  Pt will demonstrate improved pressure management with exercises and transfers to reduce abdominal doming and engage muscles correctly to improve core stability Baseline:  Goal status: INITIAL  3.  Pt will report max 2/10 neck pain with his exercises Baseline:  Goal status: INITIAL  LONG TERM GOALS: Target date: 08/09/2023    Pt will be I with advanced HEP without neck pain  Baseline:  Goal status: INITIAL  2.  Pt will demonstrated improved abdominal strength to at least 4/5 and activation to help with ease of transfers and mobility Baseline:  Goal status: INITIAL     PLAN:  PT FREQUENCY: 1-2x/week  PT DURATION:  8 sessions  PLANNED INTERVENTIONS: 97146- PT Re-evaluation, 97110-Therapeutic exercises, 97530- Therapeutic activity, 97112- Neuromuscular re-education, 97535- Self Care, 16109- Manual therapy, Patient/Family education, Taping, Dry Needling, Joint mobilization, Joint manipulation, Scar mobilization, Moist heat, and Biofeedback  PLAN FOR NEXT SESSION: abdominal and low back MFR and cont strengthening and coordination   Valery Amedee, PT 06/20/2023, 2:13 PM

## 2023-06-27 ENCOUNTER — Ambulatory Visit: Payer: PPO | Admitting: Physical Therapy

## 2023-06-27 DIAGNOSIS — M6208 Separation of muscle (nontraumatic), other site: Secondary | ICD-10-CM | POA: Diagnosis not present

## 2023-06-27 DIAGNOSIS — M6281 Muscle weakness (generalized): Secondary | ICD-10-CM

## 2023-06-27 DIAGNOSIS — R279 Unspecified lack of coordination: Secondary | ICD-10-CM

## 2023-06-27 NOTE — Therapy (Signed)
OUTPATIENT PHYSICAL THERAPY MALE PELVIC EVALUATION   Patient Name: Blake Young. MRN: [id number] DOB:1951/05/01, 72 y.o., male Today's Date: 06/27/2023  END OF SESSION:  PT End of Session - 06/27/23 1403     Visit Number 3    PT Start Time 1355    PT Stop Time 1440    PT Time Calculation (min) 45 min    Activity Tolerance Patient tolerated treatment well;No increased pain    Behavior During Therapy WFL for tasks assessed/performed               Past Medical History:  Diagnosis Date   Arthritis    Depression    Diabetes mellitus without complication (HCC)    GERD (gastroesophageal reflux disease)    Herniated cervical disc    History of kidney stones    Hyperlipidemia    Hypertension    Neuromuscular disorder (HCC)    Sleep apnea    does not use CPAP    Past Surgical History:  Procedure Laterality Date   BASAL CELL CARCINOMA EXCISION  2016   UPPER RIGHT CHEST   COLONOSCOPY  2013   CYSTOSCOPY/URETEROSCOPY/HOLMIUM LASER/STENT PLACEMENT Bilateral 05/02/2018   Procedure: CYSTOSCOPY/URETEROSCOPY/RIGHT RETROGRADE/BILATERAL STENT REMOVAL/STENT PLACEMENT LEFT URETER;  Surgeon: Jerilee Field, MD;  Location: [facility];  Service: Urology;  Laterality: Bilateral;   EXTRACORPOREAL SHOCK WAVE LITHOTRIPSY Left 04/18/2017   Procedure: LEFT EXTRACORPOREAL SHOCK WAVE LITHOTRIPSY (ESWL);  Surgeon: Bjorn Pippin, MD;  Location: [facility];  Service: Urology;  Laterality: Left;   EXTRACORPOREAL SHOCK WAVE LITHOTRIPSY Right 04/07/2018   Procedure: RIGHT EXTRACORPOREAL SHOCK WAVE LITHOTRIPSY (ESWL);  Surgeon: Crista Elliot, MD;  Location: [facility];  Service: Urology;  Laterality: Right;   EYE SURGERY Bilateral    CATARACTS   URETEROSCOPY  2019   with stents insertion   Patient Active Problem List   Diagnosis Date Noted   Chronic kidney disease 08/05/2015   Basal cell carcinoma of chest 06/16/2015   Gastroesophageal reflux disease without esophagitis 04/26/2015    Right elbow pain 04/26/2015   Skin tag 04/26/2015   Urge incontinence of urine 07/19/2014   Cornea guttata 11/12/2013   No diabetic retinopathy in both eyes 11/12/2013   Pseudophakia of both eyes 11/12/2013   Seborrheic blepharitis 11/12/2013   Vitreous liquefaction 11/12/2013   Brachial neuritis 05/29/2013   Erectile dysfunction 05/29/2013   Insomnia 05/29/2013   Obstructive sleep apnea 07/29/2012   Anterior corneal dystrophy 06/13/2012   Borderline glaucoma, open angle with borderline findings 06/13/2012   Lens replaced 06/13/2012   Regular astigmatism 06/13/2012   Type II diabetes mellitus (HCC) 06/13/2012   Neck pain 06/09/2012   Screen for colon cancer 09/25/2011   Hyperlipidemia 03/28/2011   Hypertension, benign 03/28/2011   Major depressive disorder, recurrent episode (HCC) 03/28/2011    PCP: Thana Ates, MD PCP - General  REFERRING PROVIDER: Thana Ates, MD Ref Provider (PCP)  REFERRING DIAG: M62.08 (ICD-10-CM) - Separation of muscle (nontraumatic), other site  THERAPY DIAG:  Muscle weakness (generalized)  Unspecified lack of coordination  Rationale for Evaluation and Treatment: Rehabilitation  ONSET DATE: 01/2023  SUBJECTIVE:  SUBJECTIVE STATEMENT: Pt reports that he is feeling good, he is using the app. Arms are feeling better. When they get too weak, he reports that he shakes.    Pt reports that he tried the exercises, but his arms got very tired and he is not sure if her is breathing right with the exercises.     Pt reports that he noticed his diastasis rectus abdominis about 5 months ago. No hx of abdominal surgeries. Reports that he has a hernia. He reports that he looked at some exercises, but is not sure if they are right. He does not have any low back pain.  Reports that he is retired, he walks about 3 miles.  He has some herniated discs in his neck and does not want to aggravate the neck. He does not have arm pain and neck pain. Takes Gabapentin. Feels like the neck hurts, and when he focuses on it, it hurts. It hurts all the time but he is used to it. Aggravating- stress. Has not done any abdominal strengthening recently.   PAIN:  Are you having pain? Yes, some neck pain NPRS scale: 4/10  PRECAUTIONS: None  RED FLAGS: None   WEIGHT BEARING RESTRICTIONS: No  FALLS:  Has patient fallen in last 6 months? No  LIVING ENVIRONMENT: Lives with: lives alone Lives in: House/apartment Stairs: No Has following equipment at home: None  OCCUPATION: retired, has kids all over the country, just came from visiting one for 3 months  PLOF: Independent  PATIENT GOALS: to not worsen his diastasis and learn the right exercises so he does not aggravate his neck  PERTINENT HISTORY:   See above  OBJECTIVE:  Note: Objective measures were completed at Evaluation unless otherwise noted.  COGNITION: Overall cognitive status: Within functional limits for tasks assessed     SENSATION: Light touch: Appears intact Proprioception: Appears intact  POSTURE: rounded shoulders and forward head Limited cervical AROM- left rotation 42 degrees, right rotation 44 degrees Side bending- 5 degrees Weak bilat upper extremities/ shoulders- abduction and elbow flexion  LOWER EXTREMITY MMT: Hip tightness throughout bilateral  PALPATION: GENERAL : doming of DRA with pretty firm feel  Breath holding strategies with resistance and transfer              TONE: Separation of bilateral rectus abdominis noticed- assessed 2 cm below and above umbilicus and at umbilicus  TODAY'S TREATMENT:                                                                                                                              DATE: 06/27/23      Therapeutic exercises- supine  to sit with TRANSVERSE ABDOMINIS BREATH to reduce doming Bicep curls #10 with TRANSVERSE ABDOMINIS BREATH  Rowing #10 with TRANSVERSE ABDOMINIS BREATH  Horizontal abduction with Thera band ( yellow) standing with mirror feedback Bridging with TRANSVERSE ABDOMINIS BREATH with hip adduction with ball Wall pushups with TRANSVERSE ABDOMINIS BREATH  Unilat ball  press- opposite arm and leg Palloff press yellow TB 15 reps Palloff press door green TB 15 reps Lat pulldown #20 lbs 15 reps Prone rows #10 STS with #10 with TRA breath           PATIENT EDUCATION:   Education details/ ther act: pressure management, exercises to activate transverse abdominis with transverse abdominis breath- ball press with exhale, horizontal abduction with thera band with pursed lip exhale- weakness in bilat shoulders- difficult Person educated: Patient Education method: Explanation, Tactile cues, and Verbal cues Education comprehension: verbalized understanding and needs further education  HOME EXERCISE PROGRAM:  [id number]  ASSESSMENT:  CLINICAL IMPRESSION: Pt did well with his exercises today, able to coordinate his exhale with exertion better. Needed some VC's and TC's to not over recruit his bilateral upper traps. Improving strength bilateral shoulders and core. Added new exercises to his HEP.     Pt did fairly well with his exercises, he has bilateral shoulder and arm weakness that is contributing to his core  weakness and breath holding. He reported that he has a hx of herniated cervical disc since his 30s and has been afraid to exercises and hurt his neck for 40 years. Discussed being sore is OK and progressing with HEP slowly but well to strengthen upper body to reduce breath holding and worsening his diastasis.        Patient is a 72 y.o. M who was seen today for physical therapy evaluation and treatment for diastasis rectus abdominis. He has likely been using breath holding strategies to  compensate for chronic neck pain and bilateral arm weakness and has worsened the laxity of line alba. He presents with abdominal doming and breath holding with transfers, about 2 finger gap throughout DRA, very limited C spine AROM and guarded movement. Pt seems afraid to move as well. He will benefit from PT to address deficits.   OBJECTIVE IMPAIRMENTS: decreased activity tolerance, decreased coordination, decreased endurance, decreased knowledge of condition, decreased mobility, decreased ROM, decreased strength, increased fascial restrictions, impaired tone, improper body mechanics, and pain.   ACTIVITY LIMITATIONS: carrying, lifting, bending, transfers, and caring for others  PARTICIPATION LIMITATIONS: cleaning, driving, community activity, and yard work  PERSONAL FACTORS: Time since onset of injury/illness/exacerbation and 3+ comorbidities:    are also affecting patient's functional outcome.   REHAB POTENTIAL: Good  CLINICAL DECISION MAKING: Evolving/moderate complexity  EVALUATION COMPLEXITY: Moderate   GOALS: Goals reviewed with patient? Yes  SHORT TERM GOALS: Target date: 07/12/2023    Pt will be independent with HEP.   Baseline: Goal status: INITIAL  2.  Pt will demonstrate improved pressure management with exercises and transfers to reduce abdominal doming and engage muscles correctly to improve core stability Baseline:  Goal status: INITIAL  3.  Pt will report max 2/10 neck pain with his exercises Baseline:  Goal status: INITIAL  LONG TERM GOALS: Target date: 08/09/2023    Pt will be I with advanced HEP without neck pain  Baseline:  Goal status: INITIAL  2.  Pt will demonstrated improved abdominal strength to at least 4/5 and activation to help with ease of transfers and mobility Baseline:  Goal status: INITIAL     PLAN:  PT FREQUENCY: 1-2x/week  PT DURATION:  8 sessions  PLANNED INTERVENTIONS: 97146- PT Re-evaluation, 97110-Therapeutic exercises,  97530- Therapeutic activity, 97112- Neuromuscular re-education, 97535- Self Care, 16109- Manual therapy, Patient/Family education, Taping, Dry Needling, Joint mobilization, Joint manipulation, Scar mobilization, Moist heat, and Biofeedback  PLAN FOR  NEXT SESSION: abdominal and low back MFR and cont strengthening and coordination   Raihan Kimmel, PT 06/27/2023, 2:06 PM

## 2023-07-02 ENCOUNTER — Encounter: Payer: Self-pay | Admitting: Physician Assistant

## 2023-07-02 ENCOUNTER — Ambulatory Visit (INDEPENDENT_AMBULATORY_CARE_PROVIDER_SITE_OTHER): Payer: HMO | Admitting: Physician Assistant

## 2023-07-02 DIAGNOSIS — G4700 Insomnia, unspecified: Secondary | ICD-10-CM

## 2023-07-02 DIAGNOSIS — F411 Generalized anxiety disorder: Secondary | ICD-10-CM | POA: Diagnosis not present

## 2023-07-02 DIAGNOSIS — F329 Major depressive disorder, single episode, unspecified: Secondary | ICD-10-CM

## 2023-07-02 NOTE — Progress Notes (Signed)
[facility]  Patient ID: Blake Young.,  MRN: [id number]  PCP: Blake Ates, MD  Date of Evaluation: 07/02/2023 Time spent:20 minutes  Chief Complaint:  Chief Complaint   Anxiety; Depression; Follow-up    HISTORY/CURRENT STATUS: HPI For routine med check.  "I'm doing ok."  He went [place] time with his family. Patient is able to enjoy things.  Energy and motivation are good.  No extreme sadness, tearfulness, or feelings of hopelessness.  Sleeps well sometimes but sometimes not. He'll use the Xanax and it helps.  Sometimes he wakes up in the middle of the night.  ADLs and personal hygiene are normal.   Denies any changes in concentration, making decisions, or remembering things.  Appetite has not changed.  Weight is stable.  Denies suicidal or homicidal thoughts.  Patient denies increased energy with decreased need for sleep, increased talkativeness, racing thoughts, impulsivity or risky behaviors, increased spending, increased libido, grandiosity, increased irritability or anger, paranoia, or hallucinations.  Denies dizziness, syncope, seizures, numbness, tingling, tremor, tics, unsteady gait, slurred speech, confusion. Denies muscle or joint pain, stiffness, or dystonia.  Individual Medical History/ Review of Systems: Changes? :No     Past medications for mental health diagnoses include: Cymbalta, Wellbutrin, Deplin, Elavil, Pamelor, Serzone, Nardil, Depakote, Prozac, Zoloft, Celexa, trazodone, Effexor, lithium, Abilify, Lamictal, mirtazapine, Lunesta, Restoril, Sonata, Xanax, Auvelity caused severe dizziness  Allergies: Auvelity [dextromethorphan-bupropion er], Flomax [tamsulosin hcl], Ozempic (0.25 or 0.5 mg-dose) [semaglutide(0.25 or 0.5mg -dos)], Trulicity [dulaglutide], and Victoza [liraglutide]  Current Medications:  Current Outpatient Medications:    acetaminophen (TYLENOL) 500 MG tablet, Take 500 mg by mouth., Disp: , Rfl:     ALPRAZolam (XANAX) 0.5 MG tablet, Take 1-2 tablets (0.5-1 mg total) by mouth daily as needed for anxiety., Disp: 60 tablet, Rfl: 2   amLODipine (NORVASC) 2.5 MG tablet, Take 2.5 mg by mouth daily., Disp: , Rfl:    aspirin EC 81 MG tablet, Take 81 mg by mouth daily., Disp: , Rfl:    bismuth subsalicylate (PEPTO BISMOL) 262 MG chewable tablet, Chew 524 mg by mouth as needed., Disp: , Rfl:    buPROPion (WELLBUTRIN SR) 200 MG 12 hr tablet, Take 200 mg by mouth 2 (two) times daily., Disp: , Rfl:    Cyanocobalamin (VITAMIN B12) 1000 MCG TBCR, 1 tablet Orally Once a day, Disp: , Rfl:    empagliflozin (JARDIANCE) 25 MG TABS tablet, 1 tablet Orally Once a day, Disp: , Rfl:    gabapentin (NEURONTIN) 300 MG capsule, Take 300 mg by mouth daily., Disp: , Rfl:    glipiZIDE (GLUCOTROL XL) 2.5 MG 24 hr tablet, TAKE 1 TABLET BY MOUTH ONCE DAILY IN THE MORNING WITH BREAKFAST, Disp: , Rfl:    hydrocortisone 2.5 % cream, Apply topically as needed., Disp: , Rfl:    loratadine (CLARITIN) 10 MG tablet, Take 10 mg by mouth daily as needed for allergies., Disp: , Rfl:    Multiple Vitamins-Minerals (EMERGEN-C IMMUNE PO), Take by mouth as needed., Disp: , Rfl:    omeprazole (PRILOSEC) 20 MG capsule, Take 1 capsule (20 mg total) by mouth daily. Take one capsule 30-60 minutes before dinner., Disp: 90 capsule, Rfl: 3   sildenafil (VIAGRA) 100 MG tablet, Take 100 mg by mouth as needed for erectile dysfunction., Disp: , Rfl:    TRESIBA FLEXTOUCH 100 UNIT/ML FlexTouch Pen, , Disp: , Rfl:    famotidine (PEPCID) 20 MG tablet, Take 1 tablet (20 mg total) by mouth  2 (two) times daily., Disp: 180 tablet, Rfl: 3 Medication Side Effects: none  Family Medical/ Social History: Changes? no  MENTAL HEALTH EXAM:  There were no vitals taken for this visit.There is no height or weight on file to calculate BMI.  General Appearance: Casual and Well Groomed  Eye Contact:  Good  Speech:  Clear and Coherent and Normal Rate  Volume:  Normal   Mood:  Euthymic  Affect:  Congruent  Thought Process:  Goal Directed and Descriptions of Associations: Circumstantial  Orientation:  Full (Time, Place, and Person)  Thought Content: Logical   Suicidal Thoughts:  No  Homicidal Thoughts:  No  Memory:  WNL  Judgement:  Good  Insight:  Good  Psychomotor Activity:  Normal  Concentration:  Concentration: Good  Recall:  Good  Fund of Knowledge: Good  Language: Good  Assets:  Communication Skills Desire for Improvement Financial Resources/Insurance Housing Transportation  ADL's:  Intact  Cognition: WNL  Prognosis:  Good   ECT-MADRS    Flowsheet Row Office Visit from 04/10/2022 in [facility]  MADRS Total Score 29      GAD-7    Flowsheet Row Office Visit from 06/09/2018 in [facility]  Total GAD-7 Score 5      PHQ2-9    Flowsheet Row Patient Outreach Telephone from 06/20/2020 in [facility] Patient Outreach Telephone from 02/12/2019 in [facility] Office Visit from 06/09/2018 in [facility]  PHQ-2 Total Score 1 0 4  PHQ-9 Total Score -- -- 15       DIAGNOSES:    ICD-10-CM   1. Treatment-resistant depression  F32.9     2. Generalized anxiety disorder  F41.1     3. Insomnia, unspecified type  G47.00      Receiving Psychotherapy: No   RECOMMENDATIONS:  PDMP was reviewed. Last Xanax filled 05/18/2023.  Gabapentin filled 05/19/2023 by another provider. I provided 20 minutes of face to face time during this encounter, including time spent before and after the visit in records review, medical decision making, counseling pertinent to today's visit, and charting.   Overall Blake Young is stable.  We discussed the possibility of changing the Xanax to Ativan.  He is very scared to take anything that he might get addicted to but he has taken Ativan in the past and wondered if that might be helpful for him to sleep now.  Since  he already has the Xanax I recommend he take 1 before bed and then 1 if he wakes up in the middle of the night so he can go back to sleep.  If that is not effective then call and I will send in Ativan.  He is only taking the Xanax once a week or so and I am not worried about addiction and tried to encourage him.  Of course I discussed the possible confusion and falls in anyone over 60 years old, but as little as he is taking it that is no concern to me at this time.  He understands and accepts those risks.  Continue Xanax 0.5 mg, 1-2 nightly as needed sleep.  (See above.). Continue Wellbutrin SR 200 mg, 1 p.o. twice daily.  By his PCP. Continue gabapentin 300 mg daily.  Per another provider. Return in 6 months.  Melony Overly, PA-C

## 2023-07-04 ENCOUNTER — Ambulatory Visit: Payer: PPO | Admitting: Physical Therapy

## 2023-07-15 DIAGNOSIS — E119 Type 2 diabetes mellitus without complications: Secondary | ICD-10-CM | POA: Diagnosis not present

## 2023-07-15 DIAGNOSIS — H524 Presbyopia: Secondary | ICD-10-CM | POA: Diagnosis not present

## 2023-07-15 DIAGNOSIS — H40013 Open angle with borderline findings, low risk, bilateral: Secondary | ICD-10-CM | POA: Diagnosis not present

## 2023-07-15 DIAGNOSIS — H04123 Dry eye syndrome of bilateral lacrimal glands: Secondary | ICD-10-CM | POA: Diagnosis not present

## 2023-07-15 DIAGNOSIS — H52203 Unspecified astigmatism, bilateral: Secondary | ICD-10-CM | POA: Diagnosis not present

## 2023-07-15 DIAGNOSIS — H43813 Vitreous degeneration, bilateral: Secondary | ICD-10-CM | POA: Diagnosis not present

## 2023-07-15 DIAGNOSIS — H18593 Other hereditary corneal dystrophies, bilateral: Secondary | ICD-10-CM | POA: Diagnosis not present

## 2023-07-15 DIAGNOSIS — H18513 Endothelial corneal dystrophy, bilateral: Secondary | ICD-10-CM | POA: Diagnosis not present

## 2023-09-25 DIAGNOSIS — N183 Chronic kidney disease, stage 3 unspecified: Secondary | ICD-10-CM | POA: Diagnosis not present

## 2023-10-22 DIAGNOSIS — E1165 Type 2 diabetes mellitus with hyperglycemia: Secondary | ICD-10-CM | POA: Diagnosis not present

## 2023-12-31 ENCOUNTER — Ambulatory Visit (INDEPENDENT_AMBULATORY_CARE_PROVIDER_SITE_OTHER): Payer: HMO | Admitting: Physician Assistant

## 2023-12-31 ENCOUNTER — Encounter: Payer: Self-pay | Admitting: Physician Assistant

## 2023-12-31 DIAGNOSIS — F411 Generalized anxiety disorder: Secondary | ICD-10-CM

## 2023-12-31 DIAGNOSIS — G4700 Insomnia, unspecified: Secondary | ICD-10-CM

## 2023-12-31 DIAGNOSIS — F329 Major depressive disorder, single episode, unspecified: Secondary | ICD-10-CM | POA: Diagnosis not present

## 2023-12-31 MED ORDER — ALPRAZOLAM 0.5 MG PO TABS: 0.5000 mg | ORAL_TABLET | Freq: Every day | ORAL | 2 refills | Status: DC | PRN

## 2023-12-31 NOTE — Progress Notes (Signed)
 [facility]  Patient ID: Blake Young.,  MRN: [id number]  PCP: Tena Feeling, MD  Date of Evaluation: 12/31/2023 Time spent:20 minutes  Chief Complaint:  Chief Complaint   Anxiety; Depression; Follow-up    HISTORY/CURRENT STATUS: HPI For routine 6 month med check.  Has had 5-6 months of not feeling well. More depressed, has felt tired, achy all over, not wanting to do much of anything, then about a month ago he started feeling some better. Not sure why.  He was pretty sick toward the end of the summer last year, he probably had COVID.  He did not mention this at our last visit but states he will often feel better on days that he has appointments so does not think to talk about it.  States he has tried so many different medications in the past that he is not necessarily wanting to make any changes but just thought he would mention it.  "I know that in my case it is about 25% medication and 75% behaviors."  He has seen counselors in the past and either they were not good fits or he just does not feel like they were helpful.  He would be willing to try again.  He now has more energy and motivation but still not as much as he would like.  Appetite is normal and weight is stable.  ADLs and personal hygiene are normal.  Not crying easily.  No feelings of hopelessness.  He does feel anxious sometimes but is very hesitant to take the Xanax .  It is effective when needed.  Denies suicidal or homicidal thoughts.  Patient denies increased energy with decreased need for sleep, increased talkativeness, racing thoughts, impulsivity or risky behaviors, increased spending, increased libido, grandiosity, increased irritability or anger, paranoia, or hallucinations.  Denies dizziness, syncope, seizures, numbness, tingling, tremor, tics, unsteady gait, slurred speech, confusion. Denies muscle or joint pain, stiffness, or dystonia.  Individual Medical History/ Review of Systems: Changes? :No      Past medications for mental health diagnoses include: Cymbalta, Wellbutrin , Deplin, Elavil, Pamelor, Serzone, Nardil, Depakote, Prozac, Zoloft, Celexa, trazodone, Effexor, lithium, Abilify, Lamictal, mirtazapine, Lunesta, Restoril, Sonata, Xanax , Auvelity  caused severe dizziness  Allergies: Auvelity  [dextromethorphan-bupropion  er], Flomax  [tamsulosin  hcl], Ozempic (0.25 or 0.5 mg-dose) [semaglutide(0.25 or 0.5mg -dos)], Trulicity [dulaglutide], and Victoza [liraglutide]  Current Medications:  Current Outpatient Medications:    acetaminophen  (TYLENOL ) 500 MG tablet, Take 500 mg by mouth., Disp: , Rfl:    amLODipine (NORVASC) 2.5 MG tablet, Take 2.5 mg by mouth daily., Disp: , Rfl:    aspirin EC 81 MG tablet, Take 81 mg by mouth daily., Disp: , Rfl:    bismuth subsalicylate (PEPTO BISMOL) 262 MG chewable tablet, Chew 524 mg by mouth as needed., Disp: , Rfl:    buPROPion  (WELLBUTRIN  SR) 200 MG 12 hr tablet, Take 200 mg by mouth 2 (two) times daily., Disp: , Rfl:    Cyanocobalamin (VITAMIN B12) 1000 MCG TBCR, 1 tablet Orally Once a day, Disp: , Rfl:    empagliflozin (JARDIANCE) 25 MG TABS tablet, 1 tablet Orally Once a day, Disp: , Rfl:    gabapentin (NEURONTIN) 300 MG capsule, Take 300 mg by mouth daily., Disp: , Rfl:    glipiZIDE (GLUCOTROL XL) 2.5 MG 24 hr tablet, TAKE 1 TABLET BY MOUTH ONCE DAILY IN THE MORNING WITH BREAKFAST, Disp: , Rfl:    hydrocortisone 2.5 % cream, Apply topically as needed., Disp: , Rfl:    loratadine (CLARITIN) 10 MG tablet, Take  10 mg by mouth daily as needed for allergies., Disp: , Rfl:    Multiple Vitamins-Minerals (EMERGEN-C IMMUNE PO), Take by mouth as needed., Disp: , Rfl:    omeprazole  (PRILOSEC) 20 MG capsule, Take 1 capsule (20 mg total) by mouth daily. Take one capsule 30-60 minutes before dinner., Disp: 90 capsule, Rfl: 3   sildenafil (VIAGRA) 100 MG tablet, Take 100 mg by mouth as needed for erectile dysfunction., Disp: , Rfl:    TRESIBA FLEXTOUCH 100  UNIT/ML FlexTouch Pen, , Disp: , Rfl:    ALPRAZolam  (XANAX ) 0.5 MG tablet, Take 1-2 tablets (0.5-1 mg total) by mouth daily as needed for anxiety., Disp: 60 tablet, Rfl: 2 Medication Side Effects: none  Family Medical/ Social History: Changes? no  MENTAL HEALTH EXAM:  There were no vitals taken for this visit.There is no height or weight on file to calculate BMI.  General Appearance: Casual and Well Groomed  Eye Contact:  Good  Speech:  Clear and Coherent and Normal Rate  Volume:  Normal  Mood:  Euthymic  Affect:  Congruent  Thought Process:  Goal Directed and Descriptions of Associations: Circumstantial  Orientation:  Full (Time, Place, and Person)  Thought Content: Logical   Suicidal Thoughts:  No  Homicidal Thoughts:  No  Memory:  WNL  Judgement:  Good  Insight:  Good  Psychomotor Activity:  Normal  Concentration:  Concentration: Good and Attention Span: Good  Recall:  Good  Fund of Knowledge: Good  Language: Good  Assets:  Communication Skills Desire for Improvement Financial Resources/Insurance Housing Transportation  ADL's:  Intact  Cognition: WNL  Prognosis:  Good   ECT-MADRS    Flowsheet Row Office Visit from 04/10/2022 in [facility]  MADRS Total Score 29      GAD-7    Flowsheet Row Office Visit from 06/09/2018 in [facility]  Total GAD-7 Score 5      PHQ2-9    Flowsheet Row Patient Outreach Telephone from 06/20/2020 in [facility] Patient Outreach Telephone from 02/12/2019 in [facility] Office Visit from 06/09/2018 in [facility]  PHQ-2 Total Score 1 0 4  PHQ-9 Total Score -- -- 15       DIAGNOSES:    ICD-10-CM   1. Treatment-resistant depression  F32.9     2. Generalized anxiety disorder  F41.1     3. Insomnia, unspecified type  G47.00       Receiving Psychotherapy: No   RECOMMENDATIONS:  PDMP was reviewed.  Gabapentin filled  11/16/2023.  Last Xanax  filled 05/18/2023.   I provided 20 minutes of face to face time during this encounter, including time spent before and after the visit in records review, medical decision making, counseling pertinent to today's visit, and charting.   We discussed the depression, and associated physical symptoms.  I recommend he have labs, routine CBC, CMP but also B12 and folate and thyroid  panel.  He will be seeing his PCP soon and will ask them to draw those labs.  He does not really want to make any changes with medications.  We briefly discussed Trintellix and Viibryd.  He wants to look into it before making a decision.  He feels better now so does not feel that there is an urgency for any changes.  Continue Xanax  0.5 mg, 1-2 nightly as needed sleep.  (See above.). Continue Wellbutrin  SR 200 mg, 1 p.o. twice daily.  By his PCP. Continue gabapentin 300 mg  daily.  Per another provider. Referred to Dorrine Gaudy, [facility]. Return in 3 months.  Marvia Slocumb, PA-C

## 2024-03-02 ENCOUNTER — Ambulatory Visit (INDEPENDENT_AMBULATORY_CARE_PROVIDER_SITE_OTHER): Admitting: Professional Counselor

## 2024-03-02 ENCOUNTER — Encounter: Payer: Self-pay | Admitting: Professional Counselor

## 2024-03-02 DIAGNOSIS — F332 Major depressive disorder, recurrent severe without psychotic features: Secondary | ICD-10-CM

## 2024-03-02 DIAGNOSIS — F411 Generalized anxiety disorder: Secondary | ICD-10-CM | POA: Diagnosis not present

## 2024-03-02 NOTE — Progress Notes (Addendum)
 [facility] Counsel[facility] Initial Adult Exam  Name: Blake Young. Date: 03/02/2024 MRN: [id number] DOB: 1951/01/01 PCP: Dwight Trula SQUIBB, MD  Time spent: 4;13 PM - 5;19 PM  Guardian/Payee:  pt    Paperwork requested:  No   Reason for Visit /Presenting Problem: anxiety, depression  Mental Status Exam:    Appearance:   Casual     Behavior:  Appropriate, Sharing, and Motivated  Motor:  Normal  Speech/Language:   Clear and Coherent and Normal Rate  Affect:  Appropriate and Congruent  Mood:  worried  Thought process:  normal  Thought content:    WNL  Sensory/Perceptual disturbances:    WNL  Orientation:  oriented to person, place, time/date, and situation  Attention:  Good  Concentration:  Good  Memory:  WNL  Fund of knowledge:   Good  Insight:    Good  Judgment:   Good  Impulse Control:  Good   Reported Symptoms:  anhedonia, low mood, sleep concerns, fatigue, appetite concerns, low self esteem,vague SI related to low sense of purpose, nervousness, anxiousness, worries, trouble relaxing, automatic negative thoughts, self blame, sense of overwhelm, self disparagement tendencies, low motivation, grief/loss, frustration, discouragement, interpersonal concerns, social isolating tendencies, phase of life concerns, health concerns, spiritual concerns, increased self-confidence, mind racing, pressured speech, increased energy, increased activity  Risk Assessment: Danger to Self:  Yes.  without intent/plan, vague SI Self-injurious Behavior: No Danger to Others: No Duty to Warn:no Physical Aggression / Violence:No  Access to Firearms a concern: No  Gang Involvement:No  Patient / guardian was educated about steps to take if suicide or homicide risk level increases between visits: yes While future psychiatric events cannot be accurately predicted, the patient does not currently require acute inpatient psychiatric care and does not currently meet [place]  involuntary commitment  criteria.  Substance Abuse History: Current substance abuse: No     Past Psychiatric History:   Previous psychological history is significant for anxiety and depression Outpatient Providers: 10 or so providers since late 20's History of Psych Hospitalization: Yes 1986 for 2 wks, 2001 for a mo Psychological Testing: n/a   Abuse History: Victim of No., n/a   Report needed: No. Victim of Neglect:No. Perpetrator of n/a  Witness / Exposure to Domestic Violence: No   Protective Services Involvement: No  Witness to [employer] Violence:  No   Family History:  Family History  Problem Relation Age of Onset   Alzheimer's disease Mother    Kidney disease Father    Diabetes Brother    Colon cancer Neg Hx    Rectal cancer Neg Hx    Stomach cancer Neg Hx    Esophageal cancer Neg Hx    Pancreatic cancer Neg Hx    Liver cancer Neg Hx   Son: depression Daughter: anxiety  Living situation: the patient lives alone  Sexual Orientation:  Straight  Relationship Status: divorced               If a parent, number of children / ages: 6 adult children, 14 grandchildren  Support Systems; significant other, family  Financial Stress:  Yes   Income/Employment/Disability: retirement  Financial planner: No   Educational History: Education: Engineer, maintenance (IT), Psychologist, clinical  Religion/Sprituality/World View:   Christian  Any cultural differences that may affect / interfere with treatment:  not applicable   Recreation/Hobbies: sports, reading  Stressors:Health problems   Other: mental health concerns, worry regarding daughter's health     Strengths:  Supportive Relationships, Family, Spirituality, Hopefulness, Able  to Communicate Effectively, and Significant Other, Intelligence, Resiliency  Barriers:  n/a   Legal History: Pending legal issue / charges: The patient has no significant history of legal issues. History of legal issue / charges: n/a  Medical History/Surgical  History:reviewed Past Medical History:  Diagnosis Date   Arthritis    Depression    Diabetes mellitus without complication (HCC)    GERD (gastroesophageal reflux disease)    Herniated cervical disc    History of kidney stones    Hyperlipidemia    Hypertension    Neuromuscular disorder (HCC)    Sleep apnea    does not use CPAP     Past Surgical History:  Procedure Laterality Date   BASAL CELL CARCINOMA EXCISION  2016   UPPER RIGHT CHEST   COLONOSCOPY  2013   CYSTOSCOPY/URETEROSCOPY/HOLMIUM LASER/STENT PLACEMENT Bilateral 05/02/2018   Procedure: CYSTOSCOPY/URETEROSCOPY/RIGHT RETROGRADE/BILATERAL STENT REMOVAL/STENT PLACEMENT LEFT URETER;  Surgeon: Nieves Cough, MD;  Location: [facility];  Service: Urology;  Laterality: Bilateral;   EXTRACORPOREAL SHOCK WAVE LITHOTRIPSY Left 04/18/2017   Procedure: LEFT EXTRACORPOREAL SHOCK WAVE LITHOTRIPSY (ESWL);  Surgeon: Watt Rush, MD;  Location: [facility];  Service: Urology;  Laterality: Left;   EXTRACORPOREAL SHOCK WAVE LITHOTRIPSY Right 04/07/2018   Procedure: RIGHT EXTRACORPOREAL SHOCK WAVE LITHOTRIPSY (ESWL);  Surgeon: Carolee Sherwood JONETTA DOUGLAS, MD;  Location: [facility];  Service: Urology;  Laterality: Right;   EYE SURGERY Bilateral    CATARACTS   URETEROSCOPY  2019   with stents insertion    Medications: Current Outpatient Medications  Medication Sig Dispense Refill   acetaminophen  (TYLENOL ) 500 MG tablet Take 500 mg by mouth.     ALPRAZolam  (XANAX ) 0.5 MG tablet Take 1-2 tablets (0.5-1 mg total) by mouth daily as needed for anxiety. 60 tablet 2   amLODipine (NORVASC) 2.5 MG tablet Take 2.5 mg by mouth daily.     aspirin EC 81 MG tablet Take 81 mg by mouth daily.     bismuth subsalicylate (PEPTO BISMOL) 262 MG chewable tablet Chew 524 mg by mouth as needed.     buPROPion  (WELLBUTRIN  SR) 200 MG 12 hr tablet Take 200 mg by mouth 2 (two) times daily.     Cyanocobalamin (VITAMIN B12) 1000 MCG TBCR 1 tablet Orally Once a day      empagliflozin (JARDIANCE) 25 MG TABS tablet 1 tablet Orally Once a day     gabapentin (NEURONTIN) 300 MG capsule Take 300 mg by mouth daily.     glipiZIDE (GLUCOTROL XL) 2.5 MG 24 hr tablet TAKE 1 TABLET BY MOUTH ONCE DAILY IN THE MORNING WITH BREAKFAST     hydrocortisone 2.5 % cream Apply topically as needed.     loratadine (CLARITIN) 10 MG tablet Take 10 mg by mouth daily as needed for allergies.     Multiple Vitamins-Minerals (EMERGEN-C IMMUNE PO) Take by mouth as needed.     omeprazole  (PRILOSEC) 20 MG capsule Take 1 capsule (20 mg total) by mouth daily. Take one capsule 30-60 minutes before dinner. 90 capsule 3   sildenafil (VIAGRA) 100 MG tablet Take 100 mg by mouth as needed for erectile dysfunction.     TRESIBA FLEXTOUCH 100 UNIT/ML FlexTouch Pen      No current facility-administered medications for this visit.    Allergies  Allergen Reactions   Auvelity  [Dextromethorphan-Bupropion  Er] Other (See Comments)    vertigo   Flomax  [Tamsulosin  Hcl] Other (See Comments)    Blood pressure drop   Ozempic (0.25 Or 0.5 Mg-Dose) [Semaglutide(0.25 Or  0.5mg -Dos)] Other (See Comments)    Stomach cramping   Trulicity [Dulaglutide] Other (See Comments)    Stomach cramping   Victoza [Liraglutide] Other (See Comments)    Stomach cramping    Diagnoses:    ICD-10-CM   1. Severe episode of recurrent major depressive disorder, without psychotic features (HCC)  F33.2     2. Generalized anxiety disorder  F41.1       Treatment Provided: Counselor provided person-centered counseling including active listening, building of rapport; clinical assessment; facilitation of PHQ 9 with a score of 18, GAD-7 with a score of 9, MDQ 5 out of 13 the latter symptoms of which he endorses as feeling like his normal self, not identifying as having experience of manic symptoms.  Patient presented to session to address concerns of anxiety and depression.  He shared regarding particular stressor at this time of one of  his daughters having stage IV cancer diagnosis.  He shared regarding his experience of depression since he was 73 years old, and voiced that while he has experienced anxiety most of his life as well, depression is his main concern.  Patient identified experience of depression as harder when he is alone and gets into a very dark place, when it is difficult to cope.  Counselor and patient discussed how PHQ-9 screener and often the way he presents with others does not adequately reflect his experience of depression.  He reported having participated in inpatient psychiatric care for challenges coping with anxiety and depression twice in his life.  He shared regarding his parents having died within a year of one another in their 30s, and to experience grief and loss around the circumstance.  He identified his divorce and related faith group changes during adulthood to be significant stressors as well.  Patient identified being on Wellbutrin  and said that when he is not taking it he feels worse, however he wished that medications helped more.  Patient identified having a hard time asking for help, and to have been in counseling throughout adulthood, with limited effectiveness.  He identified questioning what he wants to do with the rest of his life.  He voiced having a hard time resourcing self compassion.  He shared regarding his spiritual history and experience, and identified hoping to find spiritual community again.  Counselor and patient discussed patient strengths, support system, hobbies and interest, and began to discuss patient counseling goals.  Plan of Care: Pt is scheduled for a follow-up; continue to build rapport, assess symptoms and history, discuss treatment plan and obtain consent.  Almarie ONEIDA Sprang, [facility]

## 2024-03-12 DIAGNOSIS — B078 Other viral warts: Secondary | ICD-10-CM | POA: Diagnosis not present

## 2024-03-12 DIAGNOSIS — Z09 Encounter for follow-up examination after completed treatment for conditions other than malignant neoplasm: Secondary | ICD-10-CM | POA: Diagnosis not present

## 2024-03-12 DIAGNOSIS — L814 Other melanin hyperpigmentation: Secondary | ICD-10-CM | POA: Diagnosis not present

## 2024-03-12 DIAGNOSIS — D225 Melanocytic nevi of trunk: Secondary | ICD-10-CM | POA: Diagnosis not present

## 2024-03-12 DIAGNOSIS — R238 Other skin changes: Secondary | ICD-10-CM | POA: Diagnosis not present

## 2024-03-12 DIAGNOSIS — L821 Other seborrheic keratosis: Secondary | ICD-10-CM | POA: Diagnosis not present

## 2024-03-12 DIAGNOSIS — Z872 Personal history of diseases of the skin and subcutaneous tissue: Secondary | ICD-10-CM | POA: Diagnosis not present

## 2024-03-31 ENCOUNTER — Encounter: Payer: Self-pay | Admitting: Physician Assistant

## 2024-03-31 ENCOUNTER — Ambulatory Visit (INDEPENDENT_AMBULATORY_CARE_PROVIDER_SITE_OTHER): Admitting: Physician Assistant

## 2024-03-31 DIAGNOSIS — F329 Major depressive disorder, single episode, unspecified: Secondary | ICD-10-CM | POA: Diagnosis not present

## 2024-03-31 DIAGNOSIS — F411 Generalized anxiety disorder: Secondary | ICD-10-CM | POA: Diagnosis not present

## 2024-03-31 DIAGNOSIS — G4700 Insomnia, unspecified: Secondary | ICD-10-CM

## 2024-03-31 DIAGNOSIS — F4320 Adjustment disorder, unspecified: Secondary | ICD-10-CM

## 2024-03-31 MED ORDER — ALPRAZOLAM 0.5 MG PO TABS: 0.5000 mg | ORAL_TABLET | Freq: Every day | ORAL | 5 refills | Status: DC | PRN

## 2024-03-31 NOTE — Progress Notes (Signed)
 [facility] Med Check  Patient ID: Blake Hartog.,  MRN: [id number]  PCP: Dwight Trula SQUIBB, MD  Date of Evaluation: 03/31/2024 Time spent:20 minutes  Chief Complaint:  Chief Complaint   Anxiety; Depression; Follow-up    HISTORY/CURRENT STATUS: HPI For routine 3 month med check.  Xanax  still works well for sleep, most of the time. If he doesn't take it, there will be nights where he doesn't sleep at all. Wakes up during the night and cannot go back to sleep sometimes.He's glad to have the Xanax .   The depression is stable.  He does not stay in bed like he used 2 in the distant past.  He is able to enjoy things.  Energy and motivation are good most of the time.  Appetite is normal and weight is stable.  ADLs and personal hygiene are normal.  No mania, delirium, paranoia, hallucinations, suicidal or homicidal thoughts.  He is sad right now after his daughter was diagnosed with terminal colon cancer in May.  He is trying to be as realistic as possible.  His daughter does not want to have chemo or radiation, and they are trying to find any trial that she might be a part of.  Denies dizziness, syncope, seizures, numbness, tingling, tremor, tics, unsteady gait, slurred speech, confusion. Denies muscle or joint pain, stiffness, or dystonia.  Individual Medical History/ Review of Systems: Changes? :No     Past medications for mental health diagnoses include: Cymbalta, Wellbutrin , Deplin, Elavil, Pamelor, Serzone, Nardil, Depakote, Prozac, Zoloft, Celexa, trazodone, Effexor, lithium, Abilify, Lamictal, mirtazapine, Lunesta, Restoril, Sonata, Xanax , Auvelity  caused severe dizziness  Allergies: Auvelity  [dextromethorphan-bupropion  er], Flomax  [tamsulosin  hcl], Ozempic (0.25 or 0.5 mg-dose) [semaglutide(0.25 or 0.5mg -dos)], Trulicity [dulaglutide], and Victoza [liraglutide]  Current Medications:  Current Outpatient Medications:    acetaminophen  (TYLENOL ) 500 MG tablet, Take 500 mg by mouth.,  Disp: , Rfl:    amLODipine (NORVASC) 2.5 MG tablet, Take 2.5 mg by mouth daily., Disp: , Rfl:    aspirin EC 81 MG tablet, Take 81 mg by mouth daily., Disp: , Rfl:    bismuth subsalicylate (PEPTO BISMOL) 262 MG chewable tablet, Chew 524 mg by mouth as needed., Disp: , Rfl:    buPROPion  (WELLBUTRIN  SR) 200 MG 12 hr tablet, Take 200 mg by mouth 2 (two) times daily., Disp: , Rfl:    Cyanocobalamin (VITAMIN B12) 1000 MCG TBCR, 1 tablet Orally Once a day, Disp: , Rfl:    empagliflozin (JARDIANCE) 25 MG TABS tablet, 1 tablet Orally Once a day, Disp: , Rfl:    gabapentin (NEURONTIN) 300 MG capsule, Take 300 mg by mouth daily., Disp: , Rfl:    glipiZIDE (GLUCOTROL XL) 2.5 MG 24 hr tablet, TAKE 1 TABLET BY MOUTH ONCE DAILY IN THE MORNING WITH BREAKFAST, Disp: , Rfl:    loratadine (CLARITIN) 10 MG tablet, Take 10 mg by mouth daily as needed for allergies., Disp: , Rfl:    Multiple Vitamins-Minerals (EMERGEN-C IMMUNE PO), Take by mouth as needed., Disp: , Rfl:    omeprazole  (PRILOSEC) 20 MG capsule, Take 1 capsule (20 mg total) by mouth daily. Take one capsule 30-60 minutes before dinner., Disp: 90 capsule, Rfl: 3   sildenafil (VIAGRA) 100 MG tablet, Take 100 mg by mouth as needed for erectile dysfunction., Disp: , Rfl:    TRESIBA FLEXTOUCH 100 UNIT/ML FlexTouch Pen, , Disp: , Rfl:    ALPRAZolam  (XANAX ) 0.5 MG tablet, Take 1-2 tablets (0.5-1 mg total) by mouth daily as needed for anxiety., Disp: 60 tablet,  Rfl: 5   hydrocortisone 2.5 % cream, Apply topically as needed., Disp: , Rfl:  Medication Side Effects: none  Family Medical/ Social History: Changes? 73 yo dtr has St IV anal cancer, dx in May. Very sad.   MENTAL HEALTH EXAM:  There were no vitals taken for this visit.There is no height or weight on file to calculate BMI.  General Appearance: Casual and Well Groomed  Eye Contact:  Good  Speech:  Clear and Coherent and Normal Rate  Volume:  Normal  Mood:  Euthymic  Affect:  Congruent  Thought  Process:  Goal Directed and Descriptions of Associations: Circumstantial  Orientation:  Full (Time, Place, and Person)  Thought Content: Logical   Suicidal Thoughts:  No  Homicidal Thoughts:  No  Memory:  WNL  Judgement:  Good  Insight:  Good  Psychomotor Activity:  Normal  Concentration:  Concentration: Good and Attention Span: Good  Recall:  Good  Fund of Knowledge: Good  Language: Good  Assets:  Communication Skills Desire for Improvement Financial Resources/Insurance Housing Resilience Transportation  ADL's:  Intact  Cognition: WNL  Prognosis:  Good   ECT-MADRS    Flowsheet Row Office Visit from 04/10/2022 in [facility]  MADRS Total Score 29   GAD-7    Flowsheet Row Counselor from 03/02/2024 in [facility] Office Visit from 06/09/2018 in [facility]  Total GAD-7 Score 9 5   PHQ2-9    Flowsheet Row Counselor from 03/02/2024 in [facility] Patient Outreach Telephone from 06/20/2020 in [facility] Patient Outreach Telephone from 02/12/2019 in [facility] Office Visit from 06/09/2018 in [facility]  PHQ-2 Total Score 6 1 0 4  PHQ-9 Total Score 18 -- -- 15    DIAGNOSES:    ICD-10-CM   1. Treatment-resistant depression  F32.9     2. Generalized anxiety disorder  F41.1     3. Insomnia, unspecified type  G47.00     4. Anticipatory grief  F43.20       Receiving Psychotherapy: Yes with Cato Sprang, [facility]  RECOMMENDATIONS:  PDMP was reviewed.  Gabapentin filled 02/28/2024.  Xanax  filled 01/06/2024. I provided approximately  20 minutes of face to face time during this encounter, including time spent before and after the visit in records review, medical decision making, counseling pertinent to today's visit, and charting.   I am sorry to hear about his daughter's diagnosis.  Continue Xanax  0.5 mg, 1-2  nightly as needed sleep.  (See above.). Continue Wellbutrin  SR 200 mg, 1 p.o. twice daily.  By his PCP. Continue gabapentin 300 mg daily.  Per another provider. Continue therapy with Almarie Sprang, [facility]. Return in 6 months.  Verneita Cooks, PA-C

## 2024-04-14 ENCOUNTER — Ambulatory Visit (INDEPENDENT_AMBULATORY_CARE_PROVIDER_SITE_OTHER): Admitting: Professional Counselor

## 2024-04-14 ENCOUNTER — Encounter: Payer: Self-pay | Admitting: Professional Counselor

## 2024-04-14 DIAGNOSIS — F331 Major depressive disorder, recurrent, moderate: Secondary | ICD-10-CM | POA: Diagnosis not present

## 2024-04-14 DIAGNOSIS — F411 Generalized anxiety disorder: Secondary | ICD-10-CM | POA: Diagnosis not present

## 2024-04-14 NOTE — Progress Notes (Signed)
      [facility] Counselor/Therapist Progress Note  Patient ID: Blake Young., MRN: [id number],    Date: 04/14/2024  Time Spent: 2:10 PM to 3:13 PM  Treatment Type: Individual Therapy  Reported Symptoms: Sense of lack of fulfillment, low motivation, phase of life concerns, sadness, racing thoughts, periodic negativity, isolating tendencies, some loneliness, interpersonal concerns  Mental Status Exam:  Appearance:   Neat     Behavior:  Appropriate and Sharing  Motor:  Normal  Speech/Language:   Clear and Coherent and Normal Rate  Affect:  Appropriate and Congruent  Mood:  normal  Thought process:  normal  Thought content:    WNL  Sensory/Perceptual disturbances:    WNL  Orientation:  oriented to person, place, time/date, and situation  Attention:  Good  Concentration:  Good  Memory:  WNL  Fund of knowledge:   Good  Insight:    Good  Judgment:   Good  Impulse Control:  Good   Risk Assessment: Danger to Self:  No Self-injurious Behavior: No Danger to Others: No Duty to Warn:no Physical Aggression / Violence:No  Access to Firearms a concern: No  Gang Involvement:No   Subjective: Patient presented to session to address concerns of anxiety and depression.  He reported some progress at this time.  He reported to be questioning if he experiences depression sometimes, noting that he can go about daily functioning and has a general sense of lack of fulfillment and drive, questioning who when he was and who he is meant to be, but does not feel incapacitated.  Counselor and patient discussed patient morning and evenings when he feels most depressed, and his tendency to mask his symptoms with others.  Counselor and patient discussed patient treatment plan and patient gave his consent.  Counselor and patient continued to build rapport.  Patient processed the experience of an interpersonal concern that is ongoing across his adult life and exacerbated by factors of his faith of origin  and familial expectations, and a tendency to people please that has led him to make certain decisions in his life.  Counselor actively listened, affirmed patient feelings and experience, and assisted patient in discernment process, helping to facilitate insight into patient sense of life meaning and sense of agency.  Interventions: Solution-Oriented/Positive Psychology, Humanistic/Existential, Insight-Oriented, and Treatment Planning  Diagnosis:   ICD-10-CM   1. Major depressive disorder, recurrent episode, moderate (HCC)  F33.1     2. Generalized anxiety disorder  F41.1       Plan: Patient is scheduled for follow-up; continue process work and developing coping skills.  STG between sessions for patient to reflect on discernment concern in his interpersonal life, resourcing self compassion.  Almarie ONEIDA Sprang, [facility]

## 2024-05-07 DIAGNOSIS — E1165 Type 2 diabetes mellitus with hyperglycemia: Secondary | ICD-10-CM | POA: Diagnosis not present

## 2024-05-07 DIAGNOSIS — N1832 Chronic kidney disease, stage 3b: Secondary | ICD-10-CM | POA: Diagnosis not present

## 2024-05-07 DIAGNOSIS — I10 Essential (primary) hypertension: Secondary | ICD-10-CM | POA: Diagnosis not present

## 2024-05-07 DIAGNOSIS — E7800 Pure hypercholesterolemia, unspecified: Secondary | ICD-10-CM | POA: Diagnosis not present

## 2024-05-07 DIAGNOSIS — G4700 Insomnia, unspecified: Secondary | ICD-10-CM | POA: Diagnosis not present

## 2024-05-07 DIAGNOSIS — R809 Proteinuria, unspecified: Secondary | ICD-10-CM | POA: Diagnosis not present

## 2024-05-13 ENCOUNTER — Ambulatory Visit (INDEPENDENT_AMBULATORY_CARE_PROVIDER_SITE_OTHER): Admitting: Professional Counselor

## 2024-05-13 DIAGNOSIS — F331 Major depressive disorder, recurrent, moderate: Secondary | ICD-10-CM

## 2024-05-13 DIAGNOSIS — F411 Generalized anxiety disorder: Secondary | ICD-10-CM

## 2024-05-13 NOTE — Progress Notes (Signed)
      [facility] Counselor/Therapist Progress Note  Patient ID: Kunaal Walkins., MRN: [id number],    Date: 05/13/2024  Time Spent: 1:05 PM to 2:08 PM  Treatment Type: Individual Therapy  Reported Symptoms: Low mood, sadness, preoccupying thoughts, worries, frustration, fears, self-doubt, interpersonal concerns, phase of life concerns, spiritual concerns  Mental Status Exam:  Appearance:   Neat     Behavior:  Appropriate, Sharing, and Motivated  Motor:  Normal  Speech/Language:   Clear and Coherent and Normal Rate  Affect:  Appropriate and Congruent  Mood:  normal  Thought process:  normal  Thought content:    WNL  Sensory/Perceptual disturbances:    WNL  Orientation:  oriented to person, place, time/date, and situation  Attention:  Good  Concentration:  Good  Memory:  WNL  Fund of knowledge:   Good  Insight:    Good  Judgment:   Good  Impulse Control:  Good   Risk Assessment: Danger to Self:  No Self-injurious Behavior: No Danger to Others: No Duty to Warn:no Physical Aggression / Violence:No  Access to Firearms a concern: No  Gang Involvement:No   Subjective: Patient presented to session to address concerns of anxiety and depression.  He reported minimal progress at this time.  He processed the experience of not knowing how to get unstuck.  He processed experience of relational and spiritual concerns alike.  He shared journaling content regarding the latter, and counselor actively listened, affirmed patient feelings and experience, helped facilitate insight into patient's sense of his spiritual orientation and deliberations.  Counselor and patient discussed patient spiritual and religious experience/history/impact and formation at length.  Patient approached themes of trust and times of doubt, and counselor and patient discussed patient identification of the force within him and how such acknowledgement itself can be a form of self trust, intuition and belief.  Patient  and counselor explored what it means to ask the why of fear versus the what, toward increased self/spiritual discovery and meaning making for patient.  Patient explored areas in his life for which he is seeking change, and counselor assisted patient in strategies to increase comfort and his decision making and/or action process.  They discussed patient capacity to pull back in areas of discernment as opposed to a wholesale letting go.  Patient identified this distinction as helpful, creating space for feelings and discernment process.  Counselor utilized ACT therapy to assist patient in identifying how he might live his life to reflect his core values, a central theme for patient.  Interventions: Solution-Oriented/Positive Psychology, Humanistic/Existential, Insight-Oriented, and Spiritually-Integrated Psychotherapy, ACT  Diagnosis:   ICD-10-CM   1. Major depressive disorder, recurrent episode, moderate (HCC)  F33.1     2. Generalized anxiety disorder  F41.1       Plan: Patient is scheduled for follow-up; continue process work and developing coping skills.  Patient short-term goal between sessions to continue therapeutic journaling, practice cognitive restructuring and areas of concern, practice boundaries toward healthy versus porous and/or rigid, work to collaborate with others to adapt to reciprocal needs/desires.  Almarie ONEIDA Sprang, [facility]

## 2024-05-14 DIAGNOSIS — R3915 Urgency of urination: Secondary | ICD-10-CM | POA: Diagnosis not present

## 2024-05-14 DIAGNOSIS — N5201 Erectile dysfunction due to arterial insufficiency: Secondary | ICD-10-CM | POA: Diagnosis not present

## 2024-05-14 DIAGNOSIS — N401 Enlarged prostate with lower urinary tract symptoms: Secondary | ICD-10-CM | POA: Diagnosis not present

## 2024-06-09 DIAGNOSIS — E7800 Pure hypercholesterolemia, unspecified: Secondary | ICD-10-CM | POA: Diagnosis not present

## 2024-06-09 DIAGNOSIS — G4733 Obstructive sleep apnea (adult) (pediatric): Secondary | ICD-10-CM | POA: Diagnosis not present

## 2024-06-09 DIAGNOSIS — Z125 Encounter for screening for malignant neoplasm of prostate: Secondary | ICD-10-CM | POA: Diagnosis not present

## 2024-06-09 DIAGNOSIS — E1165 Type 2 diabetes mellitus with hyperglycemia: Secondary | ICD-10-CM | POA: Diagnosis not present

## 2024-06-09 DIAGNOSIS — Z1331 Encounter for screening for depression: Secondary | ICD-10-CM | POA: Diagnosis not present

## 2024-06-09 DIAGNOSIS — K219 Gastro-esophageal reflux disease without esophagitis: Secondary | ICD-10-CM | POA: Diagnosis not present

## 2024-06-09 DIAGNOSIS — F331 Major depressive disorder, recurrent, moderate: Secondary | ICD-10-CM | POA: Diagnosis not present

## 2024-06-09 DIAGNOSIS — Z79899 Other long term (current) drug therapy: Secondary | ICD-10-CM | POA: Diagnosis not present

## 2024-06-09 DIAGNOSIS — Z0000 Encounter for general adult medical examination without abnormal findings: Secondary | ICD-10-CM | POA: Diagnosis not present

## 2024-06-09 DIAGNOSIS — Z23 Encounter for immunization: Secondary | ICD-10-CM | POA: Diagnosis not present

## 2024-06-09 DIAGNOSIS — N1832 Chronic kidney disease, stage 3b: Secondary | ICD-10-CM | POA: Diagnosis not present

## 2024-06-11 ENCOUNTER — Ambulatory Visit: Admitting: Professional Counselor

## 2024-06-11 NOTE — Progress Notes (Signed)
 Pt did not show for scheduled appointment. Upon follow-up call pt voiced being sick past two days and having forgotten.   Almarie Sprang, [facility]

## 2024-06-30 DIAGNOSIS — H6123 Impacted cerumen, bilateral: Secondary | ICD-10-CM | POA: Diagnosis not present

## 2024-07-10 DIAGNOSIS — J189 Pneumonia, unspecified organism: Secondary | ICD-10-CM | POA: Diagnosis not present

## 2024-07-10 DIAGNOSIS — R051 Acute cough: Secondary | ICD-10-CM | POA: Diagnosis not present

## 2024-07-10 DIAGNOSIS — J22 Unspecified acute lower respiratory infection: Secondary | ICD-10-CM | POA: Diagnosis not present

## 2024-07-12 DIAGNOSIS — J189 Pneumonia, unspecified organism: Secondary | ICD-10-CM | POA: Diagnosis not present

## 2024-07-12 DIAGNOSIS — R062 Wheezing: Secondary | ICD-10-CM | POA: Diagnosis not present

## 2024-07-21 ENCOUNTER — Ambulatory Visit (INDEPENDENT_AMBULATORY_CARE_PROVIDER_SITE_OTHER): Admitting: Professional Counselor

## 2024-08-20 ENCOUNTER — Ambulatory Visit (INDEPENDENT_AMBULATORY_CARE_PROVIDER_SITE_OTHER): Admitting: Professional Counselor

## 2024-08-20 ENCOUNTER — Encounter: Payer: Self-pay | Admitting: Professional Counselor

## 2024-08-20 DIAGNOSIS — F331 Major depressive disorder, recurrent, moderate: Secondary | ICD-10-CM

## 2024-08-20 DIAGNOSIS — F411 Generalized anxiety disorder: Secondary | ICD-10-CM

## 2024-08-20 NOTE — Progress Notes (Signed)
"   °      [facility] Counselor/Therapist Progress Note  Patient ID: Blake Young., MRN: [id number],    Date: 08/20/2024  Time Spent: 2:13 PM to 3:10 PM  Treatment Type: Individual Therapy  Reported Symptoms: restlessness, anxiousness, worries, sadness, isolating tendencies, low mood, low motivation, sense of stuckness, anhedonia, sleep concerns, phase of life concerns, interpersonal concerns  Mental Status Exam:  Appearance:   Neat     Behavior:  Appropriate and Sharing  Motor:  Normal  Speech/Language:   Clear and Coherent and Normal Rate  Affect:  Appropriate and Congruent  Mood:  normal  Thought process:  normal  Thought content:    WNL  Sensory/Perceptual disturbances:    WNL  Orientation:  oriented to person and place  Attention:  Good  Concentration:  Good  Memory:  WNL  Fund of knowledge:   Good  Insight:    Good  Judgment:   Good  Impulse Control:  Good   Risk Assessment: Danger to Self:  No Self-injurious Behavior: No Danger to Others: No Duty to Warn:no Physical Aggression / Violence:No  Access to Firearms a concern: No  Gang Involvement:No   Subjective: Patient presented to session to address concerns of anxiety and depression.  Patient identified mixed progress at this time.  He reported concerns regarding his sleep medications in terms of low efficacy.  He processed the experience of sleep, identifying that he dreads going to sleep and so stays up well into the morning hours, and that the pattern disrupts his daytime productivity and wellness.  Counselor helped to facilitate insight into patient thoughts and feelings around sleep.  They discussed how going to sleep brings a measure of facing of patient sense of worry, low mood and purposelessness, by both lying down to sleep and also getting up to more of the same experience of life.  Counselor assisted patient in identifying potential strategies to mitigate symptoms and pattern.  They discussed patient possibly  punctuating the morning with an online meditation, or joining a group of his choice as with spirituality, and creating a schedule of pleasant activities.  Counselor helped patient to identify what brings him joy and what may help with motivation.  Patient identified increase self-awareness around his likes and dislikes and what he is drawn towards.  For instance he declined a volunteer role that he knew would not be for feeling for him.  Counselor reinforced patient self empowerment, and counselor and patient discussed patient cultivating closer listening to self and spirit.  Interventions: Solution-Oriented/Positive Psychology, Humanistic/Existential, Insight-Oriented, and Resourcing  Diagnosis:   ICD-10-CM   1. Major depressive disorder, recurrent episode, moderate (HCC)  F33.1     2. Generalized anxiety disorder  F41.1       Plan: Patient is scheduled for follow-up; continue process work and developing coping skills.  Patient short-term goal between sessions to practice strategies related to improved sleep patterns, and consider looking into ideas and referrals for spiritual/self discovery activities/groups per patient interest including online options, or local such as [employer] and [employer].  Almarie ONEIDA Sprang, [facility]                   "

## 2024-10-01 ENCOUNTER — Ambulatory Visit (INDEPENDENT_AMBULATORY_CARE_PROVIDER_SITE_OTHER): Admitting: Physician Assistant

## 2024-10-01 ENCOUNTER — Encounter: Payer: Self-pay | Admitting: Physician Assistant

## 2024-10-01 DIAGNOSIS — F411 Generalized anxiety disorder: Secondary | ICD-10-CM | POA: Diagnosis not present

## 2024-10-01 DIAGNOSIS — G4700 Insomnia, unspecified: Secondary | ICD-10-CM

## 2024-10-01 DIAGNOSIS — F329 Major depressive disorder, single episode, unspecified: Secondary | ICD-10-CM

## 2024-10-01 MED ORDER — ALPRAZOLAM 0.5 MG PO TABS: 0.5000 mg | ORAL_TABLET | Freq: Every day | ORAL | 5 refills | Status: AC | PRN

## 2024-10-01 NOTE — Progress Notes (Signed)
 "     [facility] Med Check  Patient ID: Blake Truex.,  MRN: [id number]  PCP: Dwight Trula SQUIBB, MD  Date of Evaluation: 10/01/2024 Time spent:20 minutes  Chief Complaint:  Chief Complaint   Depression; Anxiety; Follow-up    HISTORY/CURRENT STATUS: HPI For routine 6 month med check.  He's stable as far as his meds go. Like always, he says I don't know if the Wellbutrin  helps or not but I can tell it's working if I don't take it. Energy and motivation are ok.  He stays sad, but it doesn't keep him from doing things.   No extreme sadness, tearfulness, or feelings of hopelessness.  Trouble sleeping sometimes.  ADLs and personal hygiene are normal.   Denies any changes in concentration, making decisions, or remembering things.  Appetite has not changed.  Weight is stable. Anxiety is well controlled.  The Xanax  helps him relax to go to sleep.  No mania, delirium, AH/VH.  No SI/HI.   Individual Medical History/ Review of Systems: Changes? :Yes  had pneumonia since LOV.     Past medications for mental health diagnoses include: Cymbalta, Wellbutrin , Deplin, Elavil, Pamelor, Serzone, Nardil, Depakote, Prozac, Zoloft, Celexa, trazodone, Effexor, lithium, Abilify, Lamictal, mirtazapine, Lunesta, Restoril, Sonata, Xanax , Auvelity  caused severe dizziness  Allergies: Auvelity  [dextromethorphan-bupropion  er], Flomax  [tamsulosin  hcl], Ozempic (0.25 or 0.5 mg-dose) [semaglutide(0.25 or 0.5mg -dos)], Trulicity [dulaglutide], and Victoza [liraglutide]  Current Medications:  Current Outpatient Medications:    acetaminophen  (TYLENOL ) 500 MG tablet, Take 500 mg by mouth., Disp: , Rfl:    amLODipine (NORVASC) 2.5 MG tablet, Take 2.5 mg by mouth daily., Disp: , Rfl:    aspirin EC 81 MG tablet, Take 81 mg by mouth daily., Disp: , Rfl:    bismuth subsalicylate (PEPTO BISMOL) 262 MG chewable tablet, Chew 524 mg by mouth as needed., Disp: , Rfl:    buPROPion  (WELLBUTRIN  SR) 200 MG 12 hr tablet, Take 200 mg by  mouth 2 (two) times daily., Disp: , Rfl:    Cyanocobalamin (VITAMIN B12) 1000 MCG TBCR, 1 tablet Orally Once a day, Disp: , Rfl:    empagliflozin (JARDIANCE) 25 MG TABS tablet, 1 tablet Orally Once a day, Disp: , Rfl:    gabapentin (NEURONTIN) 300 MG capsule, Take 300 mg by mouth daily., Disp: , Rfl:    glipiZIDE (GLUCOTROL XL) 2.5 MG 24 hr tablet, TAKE 1 TABLET BY MOUTH ONCE DAILY IN THE MORNING WITH BREAKFAST, Disp: , Rfl:    hydrocortisone 2.5 % cream, Apply topically as needed., Disp: , Rfl:    loratadine (CLARITIN) 10 MG tablet, Take 10 mg by mouth daily as needed for allergies., Disp: , Rfl:    Multiple Vitamins-Minerals (EMERGEN-C IMMUNE PO), Take by mouth as needed., Disp: , Rfl:    omeprazole  (PRILOSEC) 20 MG capsule, Take 1 capsule (20 mg total) by mouth daily. Take one capsule 30-60 minutes before dinner., Disp: 90 capsule, Rfl: 3   sildenafil (VIAGRA) 100 MG tablet, Take 100 mg by mouth as needed for erectile dysfunction., Disp: , Rfl:    TRESIBA FLEXTOUCH 100 UNIT/ML FlexTouch Pen, , Disp: , Rfl:    ALPRAZolam  (XANAX ) 0.5 MG tablet, Take 1-2 tablets (0.5-1 mg total) by mouth daily as needed for anxiety., Disp: 60 tablet, Rfl: 5 Medication Side Effects: none  Family Medical/ Social History: Changes?  Dtr has anal cancer, St IV.  Had a round of chemo and the tumor shrank some but she's doing immunotherapy now.   MENTAL HEALTH EXAM:  There  were no vitals taken for this visit.There is no height or weight on file to calculate BMI.  General Appearance: Casual and Well Groomed  Eye Contact:  Good  Speech:  Clear and Coherent and Normal Rate  Volume:  Normal  Mood:  Euthymic  Affect:  Congruent  Thought Process:  Goal Directed and Descriptions of Associations: Circumstantial  Orientation:  Full (Time, Place, and Person)  Thought Content: Logical   Suicidal Thoughts:  No  Homicidal Thoughts:  No  Memory:  WNL  Judgement:  Good  Insight:  Good  Psychomotor Activity:  Normal   Concentration:  Concentration: Good and Attention Span: Good  Recall:  Good  Fund of Knowledge: Good  Language: Good  Assets:  Communication Skills Desire for Improvement Financial Resources/Insurance Housing Leisure Time Resilience Transportation  ADL's:  Intact  Cognition: WNL  Prognosis:  Good   ECT-MADRS    Flowsheet Row Office Visit from 04/10/2022 in [facility]  MADRS Total Score 29   GAD-7    Flowsheet Row Counselor from 03/02/2024 in [facility] Office Visit from 06/09/2018 in [facility]  Total GAD-7 Score 9 5   PHQ2-9    Flowsheet Row Counselor from 03/02/2024 in [facility] Patient Outreach Telephone from 06/20/2020 in [facility] Patient Outreach Telephone from 02/12/2019 in [facility] Office Visit from 06/09/2018 in [facility]  PHQ-2 Total Score 6 1 0 4  PHQ-9 Total Score 18 -- -- 15    DIAGNOSES:    ICD-10-CM   1. Treatment-resistant depression  F32.9     2. Generalized anxiety disorder  F41.1     3. Insomnia, unspecified type  G47.00      Receiving Psychotherapy: Yes with Cato Young, [facility]  RECOMMENDATIONS:  PDMP was reviewed.  Gabapentin filled 09/19/2024.  Xanax  filled 09/16/2024. I provided approximately  20 minutes of face to face time during this encounter, including time spent before and after the visit in records review, medical decision making, counseling pertinent to today's visit, and charting.   He's stable so no changes will be made.   Continue Xanax  0.5 mg, 1-2 nightly as needed sleep.  (See above.). Continue Wellbutrin  SR 200 mg, 1 p.o. twice daily.  By his PCP. Continue gabapentin 300 mg daily.  Per another provider. Continue therapy with Blake Young, [facility]. Return in 6 months.  Verneita Cooks, PA-C  "

## 2024-10-06 ENCOUNTER — Ambulatory Visit: Admitting: Professional Counselor

## 2024-11-05 ENCOUNTER — Ambulatory Visit: Admitting: Professional Counselor

## 2024-12-07 ENCOUNTER — Ambulatory Visit: Admitting: Professional Counselor

## 2025-04-01 ENCOUNTER — Ambulatory Visit: Admitting: Physician Assistant
